# Patient Record
Sex: Female | Born: 1985 | Race: Black or African American | Hispanic: No | Marital: Single | Smoking: Current every day smoker
Health system: Southern US, Community
[De-identification: ages and names within clinical notes are randomized; demographics above are authoritative.]

## PROBLEM LIST (undated history)

## (undated) DIAGNOSIS — D649 Anemia, unspecified: Secondary | ICD-10-CM

## (undated) DIAGNOSIS — M1990 Unspecified osteoarthritis, unspecified site: Secondary | ICD-10-CM

## (undated) DIAGNOSIS — S46001A Unspecified injury of muscle(s) and tendon(s) of the rotator cuff of right shoulder, initial encounter: Secondary | ICD-10-CM

## (undated) HISTORY — PX: NO PAST SURGERIES: SHX2092

---
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
## 2003-01-28 ENCOUNTER — Emergency Department (HOSPITAL_COMMUNITY): Admission: EM | Admit: 2003-01-28 | Discharge: 2003-01-29 | Payer: Self-pay | Admitting: Emergency Medicine

## 2003-07-18 ENCOUNTER — Emergency Department (HOSPITAL_COMMUNITY): Admission: EM | Admit: 2003-07-18 | Discharge: 2003-07-18 | Payer: Self-pay | Admitting: Emergency Medicine

## 2005-02-14 ENCOUNTER — Emergency Department (HOSPITAL_COMMUNITY): Admission: EM | Admit: 2005-02-14 | Discharge: 2005-02-14 | Payer: Self-pay | Admitting: Emergency Medicine

## 2005-09-14 ENCOUNTER — Other Ambulatory Visit: Admission: RE | Admit: 2005-09-14 | Discharge: 2005-09-14 | Payer: Self-pay | Admitting: Family Medicine

## 2006-01-26 ENCOUNTER — Emergency Department (HOSPITAL_COMMUNITY): Admission: EM | Admit: 2006-01-26 | Discharge: 2006-01-26 | Payer: Self-pay | Admitting: Emergency Medicine

## 2006-03-08 ENCOUNTER — Emergency Department (HOSPITAL_COMMUNITY): Admission: EM | Admit: 2006-03-08 | Discharge: 2006-03-08 | Payer: Self-pay | Admitting: Emergency Medicine

## 2006-03-09 ENCOUNTER — Ambulatory Visit: Payer: Self-pay | Admitting: Hospitalist

## 2006-04-14 ENCOUNTER — Ambulatory Visit: Payer: Self-pay | Admitting: Internal Medicine

## 2006-04-14 ENCOUNTER — Encounter (INDEPENDENT_AMBULATORY_CARE_PROVIDER_SITE_OTHER): Payer: Self-pay | Admitting: Internal Medicine

## 2006-05-19 ENCOUNTER — Encounter (INDEPENDENT_AMBULATORY_CARE_PROVIDER_SITE_OTHER): Payer: Self-pay | Admitting: Gynecology

## 2006-05-19 ENCOUNTER — Ambulatory Visit: Payer: Self-pay | Admitting: Gynecology

## 2006-07-04 DIAGNOSIS — R87619 Unspecified abnormal cytological findings in specimens from cervix uteri: Secondary | ICD-10-CM | POA: Insufficient documentation

## 2006-07-04 DIAGNOSIS — B977 Papillomavirus as the cause of diseases classified elsewhere: Secondary | ICD-10-CM | POA: Insufficient documentation

## 2007-01-24 ENCOUNTER — Inpatient Hospital Stay (HOSPITAL_COMMUNITY): Admission: AD | Admit: 2007-01-24 | Discharge: 2007-01-27 | Payer: Self-pay | Admitting: Obstetrics and Gynecology

## 2007-08-14 ENCOUNTER — Emergency Department (HOSPITAL_COMMUNITY): Admission: EM | Admit: 2007-08-14 | Discharge: 2007-08-14 | Payer: Self-pay | Admitting: Emergency Medicine

## 2007-12-31 ENCOUNTER — Emergency Department (HOSPITAL_COMMUNITY): Admission: EM | Admit: 2007-12-31 | Discharge: 2007-12-31 | Payer: Self-pay | Admitting: Emergency Medicine

## 2008-05-22 ENCOUNTER — Emergency Department (HOSPITAL_COMMUNITY): Admission: EM | Admit: 2008-05-22 | Discharge: 2008-05-23 | Payer: Self-pay | Admitting: Emergency Medicine

## 2008-06-19 ENCOUNTER — Emergency Department (HOSPITAL_COMMUNITY): Admission: EM | Admit: 2008-06-19 | Discharge: 2008-06-19 | Payer: Self-pay | Admitting: Emergency Medicine

## 2008-09-11 ENCOUNTER — Emergency Department (HOSPITAL_COMMUNITY): Admission: EM | Admit: 2008-09-11 | Discharge: 2008-09-12 | Payer: Self-pay | Admitting: Emergency Medicine

## 2008-11-13 ENCOUNTER — Emergency Department (HOSPITAL_COMMUNITY): Admission: EM | Admit: 2008-11-13 | Discharge: 2008-11-13 | Payer: Self-pay | Admitting: Emergency Medicine

## 2008-12-23 ENCOUNTER — Emergency Department (HOSPITAL_COMMUNITY): Admission: EM | Admit: 2008-12-23 | Discharge: 2008-12-23 | Payer: Self-pay | Admitting: Family Medicine

## 2009-02-11 ENCOUNTER — Emergency Department (HOSPITAL_COMMUNITY): Admission: EM | Admit: 2009-02-11 | Discharge: 2009-02-11 | Payer: Self-pay | Admitting: Emergency Medicine

## 2009-03-17 ENCOUNTER — Emergency Department (HOSPITAL_COMMUNITY): Admission: EM | Admit: 2009-03-17 | Discharge: 2009-03-17 | Payer: Self-pay | Admitting: Family Medicine

## 2009-06-19 ENCOUNTER — Emergency Department (HOSPITAL_COMMUNITY): Admission: EM | Admit: 2009-06-19 | Discharge: 2009-06-19 | Payer: Self-pay | Admitting: Family Medicine

## 2009-10-01 ENCOUNTER — Emergency Department (HOSPITAL_COMMUNITY): Admission: EM | Admit: 2009-10-01 | Discharge: 2009-10-01 | Payer: Self-pay | Admitting: Emergency Medicine

## 2010-03-24 ENCOUNTER — Emergency Department (HOSPITAL_COMMUNITY): Admission: EM | Admit: 2010-03-24 | Discharge: 2010-03-24 | Payer: Self-pay | Admitting: Emergency Medicine

## 2010-08-26 ENCOUNTER — Emergency Department (HOSPITAL_COMMUNITY)
Admission: EM | Admit: 2010-08-26 | Discharge: 2010-08-26 | Disposition: A | Discharge status: Home / Self Care | Payer: Self-pay | Attending: Emergency Medicine | Admitting: Emergency Medicine

## 2010-08-26 DIAGNOSIS — M25519 Pain in unspecified shoulder: Secondary | ICD-10-CM | POA: Insufficient documentation

## 2010-08-26 DIAGNOSIS — M545 Low back pain, unspecified: Secondary | ICD-10-CM | POA: Insufficient documentation

## 2010-09-21 LAB — POCT URINALYSIS DIP (DEVICE)
Bilirubin Urine: NEGATIVE
Glucose, UA: NEGATIVE mg/dL
Ketones, ur: NEGATIVE mg/dL
Nitrite: NEGATIVE

## 2010-09-21 LAB — WET PREP, GENITAL
Trich, Wet Prep: NONE SEEN
Yeast Wet Prep HPF POC: NONE SEEN

## 2010-09-21 LAB — GC/CHLAMYDIA PROBE AMP, GENITAL: Chlamydia, DNA Probe: NEGATIVE

## 2010-09-26 LAB — POCT URINALYSIS DIP (DEVICE)
Ketones, ur: NEGATIVE mg/dL
Protein, ur: NEGATIVE mg/dL
Specific Gravity, Urine: 1.015 (ref 1.005–1.030)
Urobilinogen, UA: 1 mg/dL (ref 0.0–1.0)
pH: 7 (ref 5.0–8.0)

## 2010-09-26 LAB — GC/CHLAMYDIA PROBE AMP, GENITAL
Chlamydia, DNA Probe: NEGATIVE
GC Probe Amp, Genital: NEGATIVE

## 2010-09-26 LAB — WET PREP, GENITAL

## 2010-09-27 LAB — POCT URINALYSIS DIP (DEVICE)
Glucose, UA: NEGATIVE mg/dL
Nitrite: NEGATIVE
Urobilinogen, UA: 2 mg/dL — ABNORMAL HIGH (ref 0.0–1.0)

## 2010-09-27 LAB — GC/CHLAMYDIA PROBE AMP, GENITAL
Chlamydia, DNA Probe: NEGATIVE
GC Probe Amp, Genital: NEGATIVE

## 2010-11-03 NOTE — Discharge Summary (Signed)
[name], [name]               ACCOUNT NO.:  [id number]   MEDICAL RECORD NO.:  [id number]          PATIENT TYPE:  INP   LOCATION:  9109                          FACILITY:  [facility]   PHYSICIAN:  Sherron Monday, MD        DATE OF BIRTH:  04-Jun-1986   DATE OF ADMISSION:  01/24/2007  DATE OF DISCHARGE:  01/27/2007                               DISCHARGE SUMMARY   ADMITTING DIAGNOSES:  Intrauterine pregnancy at term, spontaneous  rupture of membranes.   DISCHARGE DIAGNOSES:  Intrauterine pregnancy at term, spontaneous  rupture of membranes, delivered by vacuum-assisted vaginal delivery.   HISTORY OF PRESENT ILLNESS:  A 25 year old G 1, P 0, at 39-3/7 weeks  with contractions and complaints of loss of fluid that was clear.  She  states she has contractions frequently but has not been counting them.  No vaginal bleeding.  Decreased fetal movement today.  However,  as she  was being seen in the MAU it was okay.   On the external vaginal exam there was a pool and Nitrazine was  positive; however, fern was negative, but her vaginal exam had changed,  so she was admitted and her labor was augmented with the presumed  diagnosis of rupture of membranes.   PAST MEDICAL HISTORY:  Not significant.   PAST SURGICAL HISTORY:  Not significant.   PAST OB/GYN HISTORY:  G 1, present pregnancy.  She has a history of an  abnormal Pap smear with a colposcopy and that has been normal since.  She has a history of Trichomonas, no other STDs.   MEDICATIONS:  Prenatal vitamins.   ALLERGIES:  No known drug allergies.   SOCIAL HISTORY:  Denies alcohol, tobacco or drug use.  She is single.   FAMILY HISTORY:  Significant for paternal grandmother with lung cancer,  material grandmother with uterine cancer.  No diabetes.  Hypertension on  her maternal side and thyroid disease on her maternal side.   PRENATAL LABS:  Hemoglobin 12.3.  A positive.  Antibody screen negative.  Gonorrhea negative.  Chlamydia  negative.  RPR nonreactive.  Rubella  equivocal.  Hepatitis B surface antigen negative.  HIV negative.  Glucola of 88.  Group B strep screen was positive.   Ultrasounds and prenatal care revealed an [facility] of January 28, 2007; this  was performed on September 20, 2006, at 21 weeks revealing normal anatomy  except an isolated EIF, anterior placenta, and a female infant.   PHYSICAL EXAMINATION:  On admission she is afebrile, vital signs stable,  with a benign exam.  Fetal heart tones are 120s-130s and minor  variability.  Contractions are every five minutes.  Cervical exam is 3-4  cm dilated, 90% effaced and minus 1 to 0 station.   She was admitted to labor and delivery, given penicillin for prophylaxis  and Pitocin to augment her labor.  Her intrapartum course was relatively  uncomplicated.  An artificial rupture of membranes, of a likely forebag,  was performed after two doses of penicillin.  Throughout her labor she  had slow cervical change.  This was discussed with  her as a possible  need for cesarean section.  However, she did progress to complete  complete plus 1 to 2 at approximately 5:20 a.m. on the 6th of August she  pushed well to change to complete complete plus 2 to 3.  Kiwi vacuum  assistance was applied and helped the infant get to complete complete  plus 3.  There was pop-off secondary to vaginal bleeding and the  decision was made to await another application of the Kiwi.  Patient  pushed for a total of approximately two hours and 30 minutes to be able  to have a vacuum-assisted outlet delivery with no pop-off, viable female  infant at 8:07, Apgars of 8 at one minute, 9 at five minutes, and weight  of 8 pounds 4 ounces.  Placenta was expressed intact.  There were  lacerations, right paraurethral, bilateral saddle.  Lacerations were  repaired with 3-0 Vicryl.  EBL was less than 500 mL.  Her postpartum  course was relatively uncomplicated.  She remained afebrile with stable  vital signs  throughout.  Her hemoglobin decreased from 14.3 to 11.4.  She was discharged home on postpartum day #2 with no complaints, normal  lochia, and her pain was well controlled with oral pain medicine.  She  was discharged with Motrin, Vicodin, prenatal vitamins as well as  routine discharge instructions and numbers to call with any questions or  problems.   DISCHARGE INFORMATION:  A positive.  Rubella equivocal, for which she  received an MMR prior to discharge.  She was breast feeding.  Her  hemoglobin went from 14.3 to 11.4.  She plans on getting an IUD at her  six week postpartum check for contraception.      Sherron Monday, MD  Electronically Signed     JB/MEDQ  D:  01/27/2007  T:  01/27/2007  Job:  [id number]

## 2010-12-04 ENCOUNTER — Inpatient Hospital Stay (INDEPENDENT_AMBULATORY_CARE_PROVIDER_SITE_OTHER)
Admission: RE | Admit: 2010-12-04 | Discharge: 2010-12-04 | Disposition: A | Discharge status: Home / Self Care | Payer: Self-pay | Source: Ambulatory Visit | Attending: Family Medicine | Admitting: Family Medicine

## 2010-12-04 DIAGNOSIS — H6120 Impacted cerumen, unspecified ear: Secondary | ICD-10-CM

## 2010-12-12 ENCOUNTER — Inpatient Hospital Stay (INDEPENDENT_AMBULATORY_CARE_PROVIDER_SITE_OTHER)
Admission: RE | Admit: 2010-12-12 | Discharge: 2010-12-12 | Disposition: A | Discharge status: Home / Self Care | Payer: Self-pay | Source: Ambulatory Visit | Attending: Family Medicine | Admitting: Family Medicine

## 2010-12-12 DIAGNOSIS — L989 Disorder of the skin and subcutaneous tissue, unspecified: Secondary | ICD-10-CM

## 2011-03-12 LAB — URINALYSIS, ROUTINE W REFLEX MICROSCOPIC
Bilirubin Urine: NEGATIVE
Glucose, UA: NEGATIVE
Hgb urine dipstick: NEGATIVE
Ketones, ur: 15 — AB
Nitrite: NEGATIVE
Protein, ur: NEGATIVE
Specific Gravity, Urine: 1.036 — ABNORMAL HIGH
Urobilinogen, UA: 1
pH: 6

## 2011-03-12 LAB — POCT PREGNANCY, URINE
Operator id: 277751
Preg Test, Ur: NEGATIVE

## 2011-03-12 LAB — WET PREP, GENITAL
Trich, Wet Prep: NONE SEEN
Yeast Wet Prep HPF POC: NONE SEEN

## 2011-03-18 LAB — URINALYSIS, ROUTINE W REFLEX MICROSCOPIC
Bilirubin Urine: NEGATIVE
Hgb urine dipstick: NEGATIVE
Specific Gravity, Urine: 1.007
Urobilinogen, UA: 0.2
pH: 8

## 2011-03-18 LAB — WET PREP, GENITAL: Yeast Wet Prep HPF POC: NONE SEEN

## 2011-03-18 LAB — RPR: RPR Ser Ql: NONREACTIVE

## 2011-03-18 LAB — GC/CHLAMYDIA PROBE AMP, GENITAL: GC Probe Amp, Genital: NEGATIVE

## 2011-03-18 LAB — URINE MICROSCOPIC-ADD ON

## 2011-03-18 LAB — POCT PREGNANCY, URINE: Preg Test, Ur: NEGATIVE

## 2011-04-05 LAB — CBC
HCT: 34 — ABNORMAL LOW
HCT: 44.3
MCHC: 33.1
MCHC: 33.2
Platelets: 128 — ABNORMAL LOW
Platelets: 162
RBC: 4.8
RDW: 16.5 — ABNORMAL HIGH
RDW: 16.8 — ABNORMAL HIGH
WBC: 10
WBC: 18.5 — ABNORMAL HIGH

## 2011-04-05 LAB — RAPID HIV SCREEN (WH-MAU): Rapid HIV Screen: NONREACTIVE

## 2011-08-29 ENCOUNTER — Encounter (HOSPITAL_COMMUNITY): Payer: Self-pay | Admitting: *Deleted

## 2011-08-29 ENCOUNTER — Emergency Department (HOSPITAL_COMMUNITY)
Admission: EM | Admit: 2011-08-29 | Discharge: 2011-08-29 | Disposition: A | Discharge status: Home / Self Care | Payer: Self-pay | Source: Home / Self Care | Attending: Emergency Medicine | Admitting: Emergency Medicine

## 2011-08-29 DIAGNOSIS — M719 Bursopathy, unspecified: Secondary | ICD-10-CM

## 2011-08-29 DIAGNOSIS — M7552 Bursitis of left shoulder: Secondary | ICD-10-CM

## 2011-08-29 HISTORY — DX: Unspecified injury of muscle(s) and tendon(s) of the rotator cuff of right shoulder, initial encounter: S46.001A

## 2011-08-29 MED ORDER — IBUPROFEN 600 MG PO TABS: 600.0000 mg | ORAL_TABLET | Freq: Four times a day (QID) | ORAL | Status: AC | PRN

## 2011-08-29 MED ORDER — HYDROCODONE-ACETAMINOPHEN 5-325 MG PO TABS: 2.0000 | ORAL_TABLET | ORAL | Status: AC | PRN

## 2011-08-29 NOTE — Discharge Instructions (Signed)
Take the medication as written. Take 1 gram of tylenol with the motrin up to 4 times a day as needed for pain and fever. This is an effective combination for pain. Take the hydrocodone/norco only for severe pain. Do not take the tylenol and hydrocodone/norcoas they both have tylenol in them and too much can hurt your liver. Return if you get worse, have a  fever >100.4, or for any concerns.   Go to www.goodrx.com to look up your medications. This will give you a list of where you can find your prescriptions at the most affordable prices.   

## 2011-08-29 NOTE — ED Notes (Signed)
Pt with c/o left shoulder pain onset yesterday while making bed heard a pop - also pt with c/o earache and sore throat x 3 days

## 2011-08-29 NOTE — ED Provider Notes (Signed)
History     CSN: [id number]  Arrival date & time 08/29/11  1530   First MD Initiated Contact with Patient 08/29/11 1557      Chief Complaint  Patient presents with  . Shoulder Pain    onset yesterday popped while making bed   . Otalgia  . Sore Throat    (Consider location/radiation/quality/duration/timing/severity/associated sxs/prior treatment) HPI Comments: Patient is a right-handed female who reports bending forward to make a bed yesterday, and felt a "pop" in her left shoulder and she had a fully extended. No reports dull, achy, nonradiating pain in the shoulder joint, worse with use, palpation. No swelling, numbness, weakness, bruising, redness, deformity. No history of injury to this shoulder. Has been using icy hot ointment with mild relief.  ROS as noted in HPI. All other ROS negative.   Patient is a 26 y.o. female presenting with shoulder pain. No language interpreter was used.  Shoulder Pain This is a new problem. The current episode started yesterday. The problem occurs constantly. The symptoms are relieved by nothing. Treatments tried: icy hot. The treatment provided mild relief.    Past Medical History  Diagnosis Date  . Injury of right rotator cuff     History reviewed. No pertinent past surgical history.  Family History  Problem Relation Age of Onset  . Cancer Other     History  Substance Use Topics  . Smoking status: Current Everyday Smoker  . Smokeless tobacco: Not on file  . Alcohol Use: No    OB History    Grav Para Term Preterm Abortions TAB SAB Ect Mult Living                  Review of Systems  Allergies  Review of patient's allergies indicates no known allergies.  Home Medications   Current Outpatient Rx  Name Route Sig Dispense Refill  . HYDROCODONE-ACETAMINOPHEN 5-325 MG PO TABS Oral Take 2 tablets by mouth every 4 (four) hours as needed for pain. 20 tablet 0  . IBUPROFEN 600 MG PO TABS Oral Take 1 tablet (600 mg total) by mouth  every 6 (six) hours as needed for pain. 30 tablet 0    BP 108/66  Pulse 78  Temp(Src) 98.6 F (37 C) (Oral)  Resp 16  SpO2 100%  Physical Exam  Nursing note and vitals reviewed. Constitutional: She is oriented to person, place, and time. She appears well-developed and well-nourished. No distress.  HENT:  Head: Normocephalic and atraumatic.  Eyes: Conjunctivae and EOM are normal.  Neck: Normal range of motion.  Cardiovascular: Normal rate.   Pulmonary/Chest: Effort normal.  Abdominal: She exhibits no distension.  Musculoskeletal: Normal range of motion.       Left shoulder with ROM  somewhat limited, Drop test painful but negative, Tenderness lateral aspect of shoulder, and along bicipital tendon. clavicle NT, A/C joint NT, scapula NT, proximal humerus NT.  apprehension test negative  Motor strength decreased at shoulder due to pain, Sensation intact LT over deltoid region, distal NVI with hand on affected side having intact sensation and strength in the distribution of the median, radial, and ulnar nerve. Negative empty can, liftoff test.  Neurological: She is alert and oriented to person, place, and time.  Skin: Skin is warm and dry.  Psychiatric: She has a normal mood and affect. Her behavior is normal. Judgment and thought content normal.    ED Course  Procedures (including critical care time)  Labs Reviewed - No data to display No  results found.   1. Bursitis of shoulder, left       MDM  Neg empty can, liftoff test. Tender bicipital tendion, and along shoulder bursae. No sx/sn rotator cuff tear. More suggestive of bursitis.  Domenick Gong, MD 08/29/11 2118

## 2011-11-02 ENCOUNTER — Emergency Department (HOSPITAL_COMMUNITY)
Admission: EM | Admit: 2011-11-02 | Discharge: 2011-11-03 | Disposition: A | Discharge status: Home / Self Care | Payer: Self-pay | Attending: Emergency Medicine | Admitting: Emergency Medicine

## 2011-11-02 ENCOUNTER — Encounter (HOSPITAL_COMMUNITY): Payer: Self-pay | Admitting: Adult Health

## 2011-11-02 DIAGNOSIS — N949 Unspecified condition associated with female genital organs and menstrual cycle: Secondary | ICD-10-CM | POA: Insufficient documentation

## 2011-11-02 DIAGNOSIS — A499 Bacterial infection, unspecified: Secondary | ICD-10-CM | POA: Insufficient documentation

## 2011-11-02 DIAGNOSIS — B9689 Other specified bacterial agents as the cause of diseases classified elsewhere: Secondary | ICD-10-CM | POA: Insufficient documentation

## 2011-11-02 DIAGNOSIS — R102 Pelvic and perineal pain: Secondary | ICD-10-CM

## 2011-11-02 DIAGNOSIS — N760 Acute vaginitis: Secondary | ICD-10-CM | POA: Insufficient documentation

## 2011-11-02 LAB — URINALYSIS, ROUTINE W REFLEX MICROSCOPIC
Bilirubin Urine: NEGATIVE
Leukocytes, UA: NEGATIVE
Nitrite: NEGATIVE
Specific Gravity, Urine: 1.025 (ref 1.005–1.030)
Urobilinogen, UA: 0.2 mg/dL (ref 0.0–1.0)
pH: 6 (ref 5.0–8.0)

## 2011-11-02 LAB — POCT PREGNANCY, URINE: Preg Test, Ur: NEGATIVE

## 2011-11-02 LAB — WET PREP, GENITAL

## 2011-11-02 NOTE — ED Provider Notes (Signed)
History     CSN: [id number]  Arrival date & time 11/02/11  2052   First MD Initiated Contact with Patient 11/02/11 2244      Chief Complaint  Patient presents with  . Abdominal Pain    (Consider location/radiation/quality/duration/timing/severity/associated sxs/prior treatment) HPI Comments: Patient with history of HPV presents emergency department with chief complaint of right lower quadrant abdominal pain.  Onset of symptoms began 3 days ago and are described as intermittent and pressure-like in nature.  Associated symptoms include increase abnormal white discharge, the patient denies any older.  Last menstrual period is unknown and patient states that she might be pregnant because she missed her last Depo shot.  Patient states she has a OB/GYN appointment for next week for irregular checkup.  Patient is sexually active and does not currently use protection with her partner. She denies urinary symptoms, fevers, night sweats, chills.  Patient is a 26 y.o. female presenting with abdominal pain. The history is provided by the patient.  Abdominal Pain The primary symptoms of the illness include abdominal pain and vaginal discharge. The primary symptoms of the illness do not include fever, shortness of breath, nausea, vomiting, diarrhea, dysuria or vaginal bleeding.  The vaginal discharge is not associated with dysuria.  Symptoms associated with the illness do not include chills, constipation, urgency, hematuria or frequency.    Past Medical History  Diagnosis Date  . Injury of right rotator cuff     History reviewed. No pertinent past surgical history.  Family History  Problem Relation Age of Onset  . Cancer Other     History  Substance Use Topics  . Smoking status: Current Everyday Smoker  . Smokeless tobacco: Not on file  . Alcohol Use: No    OB History    Grav Para Term Preterm Abortions TAB SAB Ect Mult Living                  Review of Systems  Constitutional:  Negative for fever, chills and appetite change.  HENT: Negative for congestion.   Eyes: Negative for visual disturbance.  Respiratory: Negative for shortness of breath.   Cardiovascular: Negative for chest pain and leg swelling.  Gastrointestinal: Positive for abdominal pain. Negative for nausea, vomiting, diarrhea, constipation, blood in stool and rectal pain.  Genitourinary: Positive for vaginal discharge. Negative for dysuria, urgency, frequency, hematuria, decreased urine volume, vaginal bleeding, genital sores, vaginal pain and pelvic pain.  Neurological: Negative for dizziness, syncope, weakness, light-headedness, numbness and headaches.  Psychiatric/Behavioral: Negative for confusion.  All other systems reviewed and are negative.    Allergies  Review of patient's allergies indicates no known allergies.  Home Medications  No current outpatient prescriptions on file.  BP 124/73  Pulse 83  Temp(Src) 98.3 F (36.8 C) (Oral)  Resp 18  SpO2 100%  Physical Exam  Nursing note and vitals reviewed. Constitutional: Vital signs are normal. She appears well-developed and well-nourished. No distress.  HENT:  Head: Normocephalic and atraumatic.  Mouth/Throat: Uvula is midline, oropharynx is clear and moist and mucous membranes are normal.  Eyes: Conjunctivae and EOM are normal. Pupils are equal, round, and reactive to light.  Neck: Normal range of motion and full passive range of motion without pain. Neck supple. No spinous process tenderness and no muscular tenderness present. No rigidity. No Brudzinski's sign noted.  Cardiovascular: Normal rate and regular rhythm.   Pulmonary/Chest: Effort normal and breath sounds normal. No accessory muscle usage. Not tachypneic. No respiratory distress.  Abdominal: Soft.  Normal appearance. She exhibits no distension, no ascites, no pulsatile midline mass and no mass. There is tenderness. There is no CVA tenderness. No hernia.         Mild RLQ abd  tenderness, without peritoneal signs  Genitourinary: Pelvic exam was performed with patient supine.       Exam performed by Jaci Carrel,  exam chaperoned Date: 11/02/2011 Pelvic exam: normal external genitalia without evidence of trauma. VULVA: normal appearing vulva with no masses, tenderness or lesion. VAGINA: normal appearing vagina with normal color and discharge, no lesions. CERVIX: normal appearing cervix without lesions, cervical motion tenderness absent, cervical os closed with out purulent discharge; vaginal discharge - white and copious, Wet prep and DNA probe for chlamydia and GC obtained.   ADNEXA: normal adnexa in size, nontender and no masses UTERUS: uterus is normal size, shape, consistency and nontender.    Lymphadenopathy:    She has no cervical adenopathy.  Neurological: She is alert.  Skin: Skin is warm and dry. No rash noted. She is not diaphoretic.  Psychiatric: She has a normal mood and affect. Her speech is normal and behavior is normal.    ED Course  Procedures (including critical care time)  Labs Reviewed  WET PREP, GENITAL - Abnormal; Notable for the following:    Clue Cells Wet Prep HPF POC MANY (*)    WBC, Wet Prep HPF POC MANY (*)    All other components within normal limits  URINALYSIS, ROUTINE W REFLEX MICROSCOPIC  POCT PREGNANCY, URINE  GC/CHLAMYDIA PROBE AMP, GENITAL   No results found.   No diagnosis found.    MDM  Vaginal discharge, Bacterial Vaginosis, ?STI   Patient to be discharged with instructions to follow up with OBGYN. Discussed importance of using protection when sexually active. Pt understands that they have GC/Chlamydia cultures pending and that they will need to inform all sexual partners if results return positive. Pt has been treated prophylacticly with azithromycin and rocephin due to pts history of HPV and wet prep with increased WBCs. Pt not concerning for PID because hemodynamically stable and no cervical motion tenderness  on pelvic exam. Pt has also been treated with flagyl for Bacterial Vaginosis. Pt has been advised to not drink alcohol while on this medication. Transvaginal US without evidence of torsion or TOA        Coralynn Gaona, PA-C 11/04/11 0041

## 2011-11-02 NOTE — ED Notes (Signed)
Reports lower abdominal pain described as cramping  that began a couple days ago associated with white discharge denies odor. States, "I may be pregnant" missed last depo shot. LMP: unsure.

## 2011-11-03 ENCOUNTER — Emergency Department (HOSPITAL_COMMUNITY): Payer: Self-pay

## 2011-11-03 MED ORDER — AZITHROMYCIN 250 MG PO TABS
1000.0000 mg | ORAL_TABLET | Freq: Once | ORAL | Status: AC
  Administered 2011-11-03: 1000 mg via ORAL
  Filled 2011-11-03: qty 4

## 2011-11-03 MED ORDER — NAPROXEN 500 MG PO TABS: 500.0000 mg | ORAL_TABLET | Freq: Two times a day (BID) | ORAL | Status: DC

## 2011-11-03 MED ORDER — ONDANSETRON 4 MG PO TBDP
8.0000 mg | ORAL_TABLET | Freq: Once | ORAL | Status: AC
  Administered 2011-11-03: 8 mg via ORAL
  Filled 2011-11-03: qty 2

## 2011-11-03 MED ORDER — METRONIDAZOLE 500 MG PO TABS
2000.0000 mg | ORAL_TABLET | Freq: Once | ORAL | Status: AC
  Administered 2011-11-03: 2000 mg via ORAL
  Filled 2011-11-03: qty 4

## 2011-11-03 MED ORDER — CEFTRIAXONE SODIUM 250 MG IJ SOLR
250.0000 mg | Freq: Once | INTRAMUSCULAR | Status: AC
  Administered 2011-11-03: 250 mg via INTRAMUSCULAR
  Filled 2011-11-03: qty 250

## 2011-11-03 NOTE — ED Notes (Signed)
Pt resting in stretcher.  Pt aware of possible long wait for [place].  Pt denies any needs at this time.

## 2011-11-03 NOTE — ED Notes (Signed)
Pt complaining of nausea. MD notified.

## 2011-11-03 NOTE — Discharge Instructions (Signed)
You have been treated in the emergency department for an infection, possibly sexually transmitted. Results of your gonorrhea and chlamydia tests are pending and you will be notified if they are positive. It is very important to practice safe sex and use condoms when sexually active. If your results are positive you need to notify all sexual partners so they can be treated as well. The website [email or web address].net/ can be used to send anonymous text messages or emails to alert sexual contacts. Follow up with your doctor, or OBGYN in regards to today's visit.    Bacterial Vaginosis Bacterial vaginosis (BV) is a vaginal infection where the normal balance of bacteria in the vagina is disrupted. The normal balance is then replaced by an overgrowth of certain bacteria. There are several different kinds of bacteria that can cause BV. BV is the most common vaginal infection in women of childbearing age. CAUSES   The cause of BV is not fully understood. BV develops when there is an increase or imbalance of harmful bacteria.   Some activities or behaviors can upset the normal balance of bacteria in the vagina and put women at increased risk including:   Having a new sex partner or multiple sex partners.   Douching.   Using an intrauterine device (IUD) for contraception.   It is not clear what role sexual activity plays in the development of BV. However, women that have never had sexual intercourse are rarely infected with BV.  Women do not get BV from toilet seats, bedding, swimming pools or from touching objects around them.  SYMPTOMS   Grey vaginal discharge.   A fish-like odor with discharge, especially after sexual intercourse.   Itching or burning of the vagina and vulva.   Burning or pain with urination.   Some women have no signs or symptoms at all.  DIAGNOSIS  Your caregiver must examine the vagina for signs of BV. Your caregiver will perform lab tests and look at the sample of  vaginal fluid through a microscope. They will look for bacteria and abnormal cells (clue cells), a pH test higher than 4.5, and a positive amine test all associated with BV.  RISKS AND COMPLICATIONS   Pelvic inflammatory disease (PID).   Infections following gynecology surgery.   Developing HIV.   Developing herpes virus.  TREATMENT  Sometimes BV will clear up without treatment. However, all women with symptoms of BV should be treated to avoid complications, especially if gynecology surgery is planned. Female partners generally do not need to be treated. However, BV may spread between female sex partners so treatment is helpful in preventing a recurrence of BV.   BV may be treated with antibiotics. The antibiotics come in either pill or vaginal cream forms. Either can be used with nonpregnant or pregnant women, but the recommended dosages differ. These antibiotics are not harmful to the baby.   BV can recur after treatment. If this happens, a second round of antibiotics will often be prescribed.   Treatment is important for pregnant women. If not treated, BV can cause a premature delivery, especially for a pregnant woman who had a premature birth in the past. All pregnant women who have symptoms of BV should be checked and treated.   For chronic reoccurrence of BV, treatment with a type of prescribed gel vaginally twice a week is helpful.  HOME CARE INSTRUCTIONS   Finish all medication as directed by your caregiver.   Do not have sex until treatment is completed.   Tell  your sexual partner that you have a vaginal infection. They should see their caregiver and be treated if they have problems, such as a mild rash or itching.   Practice safe sex. Use condoms. Only have 1 sex partner.  PREVENTION  Basic prevention steps can help reduce the risk of upsetting the natural balance of bacteria in the vagina and developing BV:  Do not have sexual intercourse (be abstinent).   Do not douche.     Use all of the medicine prescribed for treatment of BV, even if the signs and symptoms go away.   Tell your sex partner if you have BV. That way, they can be treated, if needed, to prevent reoccurrence.  SEEK MEDICAL CARE IF:   Your symptoms are not improving after 3 days of treatment.   You have increased discharge, pain, or fever.   Gonorrhea and Chlamydia SYMPTOMS  In females, symptoms may go unnoticed. Symptoms that are more noticeable can include:  Belly (abdominal) pain.  Painful intercourse.  Watery mucous-like discharge from the vagina.  Miscarriage.  Discomfort when urinating.  Inflammation of the rectum.  Abnormal gray-green frothy vaginal discharge  Vaginal itching and irritatio  Itching and irritation of the area outside the vagina.   Painful urination.  Bleeding after sexual intercourse.  In males, symptoms include:  Burning with urination.  Pain in the testicles.  Watery mucous-like discharge from the penis.  It can cause longstanding (chronic) pelvic pain after frequent infections.  TREATMENT  PID can cause women to not be able to have children (sterile) if left untreated or if half-treated.  It is important to finish ALL medications given to you.  This is a sexually transmitted infection. So you are also at risk for other sexually transmitted diseases, including HIV (AIDS), it is recommended that you get tested. HOME CARE INSTRUCTIONS  Warning: This infection is contagious. Do not have sex until treatment is completed. Follow up at your caregiver's office or the clinic to which you were referred. If your diagnosis (learning what is wrong) is confirmed by culture or some other method, your recent sexual contacts need treatment. Even if they are symptom free or have a negative culture or evaluation, they should be treated.  PREVENTION  Women should use sanitary pads instead of tampons for vaginal discharge.  Wipe front to back after using the toilet and avoid  douching.   Practice safe sex, use condoms, have only one sex partner and be sure your sex partner is not having sex with others.  Ask your caregiver to test you for chlamydia at your regular checkups or sooner if you are having symptoms.  Ask for further information if you are pregnant.  SEEK IMMEDIATE MEDICAL CARE IF:  You develop an oral temperature above 102 F (38.9 C), not controlled by medications or lasting more than 2 days.  You develop an increase in pain.  You develop any type of abnormal discharge.  You develop vaginal bleeding and it is not time for your period.  You develop painful intercourse.   Bacterial Vaginosis  Bacterial vaginosis (BV) is a vaginal infection where the normal balance of bacteria in the vagina is disrupted. This is not a sexually transmitted disease and your sexual partners do NOT need to be treated. CAUSES  The cause of BV is not fully understood. BV develops when there is an increase or imbalance of harmful bacteria.  Some activities or behaviors can upset the normal balance of bacteria in the vagina and  put women at increased risk including:  Having a new sex partner or multiple sex partners.  Douching.  Using an intrauterine device (IUD) for contraception.  It is not clear what role sexual activity plays in the development of BV. However, women that have never had sexual intercourse are rarely infected with BV.  Women do not get BV from toilet seats, bedding, swimming pools or from touching objects around them.   SYMPTOMS  Grey vaginal discharge.  A fish-like odor with discharge, especially after sexual intercourse.  Itching or burning of the vagina and vulva.  Burning or pain with urination.  Some women have no signs or symptoms at all.   TREATMENT  Sometimes BV will clear up without treatment.  BV may be treated with antibiotics.  BV can recur after treatment. If this happens, a second round of antibiotics will often be prescribed.  HOME CARE  INSTRUCTIONS  Finish all medication as directed by your caregiver.  Do not have sex until treatment is completed.  Do NOT drink any alcoholic beverages while being treated  with Metronidazole (Flagyl). This will cause a severe reaction inducing vomiting.  RESOURCE GUIDE  Dental Problems  Patients with Medicaid: [facility] [phone] [address]                                           [phone] [address] Phone:  [phone]                                                  Phone:  [phone]  If unable to pay or uninsured, contact:  [facility] or [facility]. to become qualified for the adult dental clinic.  Chronic Pain Problems Contact [facility]  [phone] Patients need to be referred by their primary care doctor.  Insufficient Money for Medicine Contact [employer]:  call "211" or [facility] [phone]) [phone].  No Primary Care Doctor Call [facility]  [phone] Other agencies that provide inexpensive medical care    [facility]  [phone]    [facility]  [phone]    [facility]  [phone]    [facility]  [phone]    [facility]  [phone]    [facility]  [phone]  Psychological Services [facility]  [phone] [facility]  [phone] [facility]   [phone] (emergency services [phone])  Substance Abuse Resources [facility]  [phone] [facility] 667-039-6764 [facility] [phone] Floydene Flock [phone] Residential & Outpatient Substance Abuse Program  [phone]  Abuse/Neglect [facility] Child Abuse Hotline 781-623-5399 [facility] Child Abuse Hotline 205-776-1450 (After Hours)  Emergency Shelter [facility] [phone]  M[facility] [place] [facility] [phone] [facility]  Services [phone]  MRSA Hotline #:   (860)527-7324    [facility]  [facility] [address] [place]                       [address]      [address]  [place]                                                [place]                            [place] Phone:  [phone]                                   Phone:  [phone]                 Phone:  [phone]  [place] [facility] Phone:  [phone]  [place] Child Abuse Hotline (336) 342-1394 ([phone] (After Hours)    

## 2011-11-03 NOTE — ED Notes (Signed)
Provided pt with snack

## 2011-11-03 NOTE — ED Notes (Addendum)
R pelvic pain, associated with vaginal d/c  PE:  Has soft non tender abd, on pelvic per PA has adnexal ttp.  Pt appears in no distress, no n/v prior to evaluation.  Assessment:  [place] ordered to r/o torsion /cyst, abx given for d/c and ttp in the pelvic exam, labs pending for GC / Chlamydia, pt educated on results and need for f/u.  Medical screening examination/treatment/procedure(s) were conducted as a shared visit with non-physician practitioner(s) and myself.  I personally evaluated the patient during the encounter  Patient reevaluated and still has a soft nontender abdomen, ultrasound results reviewed with the patient and indications for return given. Patient agrees to followup within 24 hours if she should develop worsening or continuing pain. She states that she has a family doctor that she can call, Naprosyn given as a prescription.  Vida Roller, MD 11/03/11 [phone]  Vida Roller, MD 11/03/11 [phone]

## 2011-11-03 NOTE — ED Notes (Signed)
Patient transported to Ultrasound 

## 2011-11-03 NOTE — ED Notes (Signed)
Patient is AOx4 and comfortable with her discharge instructions. 

## 2011-11-03 NOTE — ED Notes (Signed)
Pt continues at U/S

## 2011-11-04 LAB — GC/CHLAMYDIA PROBE AMP, GENITAL
Chlamydia, DNA Probe: NEGATIVE
GC Probe Amp, Genital: NEGATIVE

## 2011-11-04 NOTE — ED Provider Notes (Signed)
Medical screening examination/treatment/procedure(s) were performed by non-physician practitioner and as supervising physician I was immediately available for consultation/collaboration.   Forbes Cellar, MD 11/04/11 [phone]

## 2012-01-31 ENCOUNTER — Encounter (HOSPITAL_COMMUNITY): Payer: Self-pay | Admitting: *Deleted

## 2012-01-31 ENCOUNTER — Emergency Department (HOSPITAL_COMMUNITY)
Admission: EM | Admit: 2012-01-31 | Discharge: 2012-01-31 | Disposition: A | Discharge status: Home / Self Care | Payer: Self-pay | Attending: Emergency Medicine | Admitting: Emergency Medicine

## 2012-01-31 DIAGNOSIS — F17200 Nicotine dependence, unspecified, uncomplicated: Secondary | ICD-10-CM | POA: Insufficient documentation

## 2012-01-31 DIAGNOSIS — H6120 Impacted cerumen, unspecified ear: Secondary | ICD-10-CM | POA: Insufficient documentation

## 2012-01-31 NOTE — ED Notes (Signed)
Pt is here with bilateral ear fullness for one week

## 2012-01-31 NOTE — ED Provider Notes (Signed)
History     CSN: [id number]  Arrival date & time 01/31/12  1422   First MD Initiated Contact with Patient 01/31/12 1628      Chief Complaint  Patient presents with  . Ear Fullness    (Consider location/radiation/quality/duration/timing/severity/associated sxs/prior treatment) Patient is a 26 y.o. female presenting with plugged ear sensation. The history is provided by the patient.  Ear Fullness  pt c/o bil ear fullness, muffled sounds for past several days. Constant. Moderate. No specific exacerbating or alleviating factors. No hx same. Denies nasal/sinus congestion. No sore throat. No allergy symptoms. Denies headache. No fevers. No trauma to ear, no drainage.   Past Medical History  Diagnosis Date  . Injury of right rotator cuff     History reviewed. No pertinent past surgical history.  Family History  Problem Relation Age of Onset  . Cancer Other     History  Substance Use Topics  . Smoking status: Current Everyday Smoker  . Smokeless tobacco: Not on file  . Alcohol Use: No    OB History    Grav Para Term Preterm Abortions TAB SAB Ect Mult Living                  Review of Systems  Constitutional: Negative for fever.  HENT: Negative for congestion, sore throat and rhinorrhea.     Allergies  Review of patient's allergies indicates no known allergies.  Home Medications  No current outpatient prescriptions on file.  BP 188/72  Pulse 78  Temp 98.5 F (36.9 C) (Oral)  Resp 18  SpO2 98%  Physical Exam  HENT:  Nose: Nose normal.  Mouth/Throat: Oropharynx is clear and moist.       bil cerumen impaction, right more so than left.   Eyes: Conjunctivae are normal.  Cardiovascular: Normal rate.   Pulmonary/Chest: Effort normal.  Lymphadenopathy:    She has no cervical adenopathy.  Neurological: She is alert.  Skin: Skin is warm and dry.    ED Course  Procedures (including critical care time)     MDM  Nursing staff to do ear irrigation re  cerumen impaction.          Suzi Roots, MD 01/31/12 1640

## 2012-07-04 ENCOUNTER — Emergency Department (HOSPITAL_COMMUNITY)
Admission: EM | Admit: 2012-07-04 | Discharge: 2012-07-04 | Disposition: A | Discharge status: Home / Self Care | Payer: Self-pay | Attending: Emergency Medicine | Admitting: Emergency Medicine

## 2012-07-04 ENCOUNTER — Encounter (HOSPITAL_COMMUNITY): Payer: Self-pay | Admitting: *Deleted

## 2012-07-04 DIAGNOSIS — F17200 Nicotine dependence, unspecified, uncomplicated: Secondary | ICD-10-CM | POA: Insufficient documentation

## 2012-07-04 DIAGNOSIS — K529 Noninfective gastroenteritis and colitis, unspecified: Secondary | ICD-10-CM

## 2012-07-04 DIAGNOSIS — Z3202 Encounter for pregnancy test, result negative: Secondary | ICD-10-CM | POA: Insufficient documentation

## 2012-07-04 DIAGNOSIS — M545 Low back pain, unspecified: Secondary | ICD-10-CM | POA: Insufficient documentation

## 2012-07-04 DIAGNOSIS — Z87828 Personal history of other (healed) physical injury and trauma: Secondary | ICD-10-CM | POA: Insufficient documentation

## 2012-07-04 DIAGNOSIS — R109 Unspecified abdominal pain: Secondary | ICD-10-CM | POA: Insufficient documentation

## 2012-07-04 DIAGNOSIS — R197 Diarrhea, unspecified: Secondary | ICD-10-CM | POA: Insufficient documentation

## 2012-07-04 DIAGNOSIS — K5289 Other specified noninfective gastroenteritis and colitis: Secondary | ICD-10-CM | POA: Insufficient documentation

## 2012-07-04 LAB — COMPREHENSIVE METABOLIC PANEL
ALT: 48 U/L — ABNORMAL HIGH (ref 0–35)
Alkaline Phosphatase: 62 U/L (ref 39–117)
BUN: 13 mg/dL (ref 6–23)
CO2: 28 mEq/L (ref 19–32)
Calcium: 9.4 mg/dL (ref 8.4–10.5)
GFR calc Af Amer: >90 mL/min (ref >90)
GFR calc non Af Amer: >90 mL/min (ref >90)
Glucose, Bld: 119 mg/dL — ABNORMAL HIGH (ref 70–99)
Sodium: 140 mEq/L (ref 135–145)
Total Protein: 8.4 g/dL — ABNORMAL HIGH (ref 6.0–8.3)

## 2012-07-04 LAB — URINALYSIS, ROUTINE W REFLEX MICROSCOPIC
Bilirubin Urine: NEGATIVE
Ketones, ur: NEGATIVE mg/dL
Nitrite: NEGATIVE
Specific Gravity, Urine: 1.034 — ABNORMAL HIGH (ref 1.005–1.030)
Urobilinogen, UA: 0.2 mg/dL (ref 0.0–1.0)
pH: 8 (ref 5.0–8.0)

## 2012-07-04 LAB — URINE MICROSCOPIC-ADD ON

## 2012-07-04 LAB — CBC WITH DIFFERENTIAL/PLATELET
Basophils Relative: 0 % (ref 0–1)
Eosinophils Absolute: 0 10*3/uL (ref 0.0–0.7)
Eosinophils Relative: 0 % (ref 0–5)
HCT: 41.2 % (ref 36.0–46.0)
Hemoglobin: 14.2 g/dL (ref 12.0–15.0)
MCH: 30.6 pg (ref 26.0–34.0)
MCHC: 34.5 g/dL (ref 30.0–36.0)
MCV: 88.8 fL (ref 78.0–100.0)
Monocytes Absolute: 0.3 10*3/uL (ref 0.1–1.0)
Neutro Abs: 7.8 10*3/uL — ABNORMAL HIGH (ref 1.7–7.7)
RBC: 4.64 MIL/uL (ref 3.87–5.11)

## 2012-07-04 MED ORDER — ONDANSETRON 4 MG PO TBDP: 4.0000 mg | ORAL_TABLET | Freq: Three times a day (TID) | ORAL | Status: DC | PRN

## 2012-07-04 MED ORDER — HYDROCODONE-ACETAMINOPHEN 5-325 MG PO TABS: 2.0000 | ORAL_TABLET | ORAL | Status: DC | PRN

## 2012-07-04 MED ORDER — FENTANYL CITRATE 0.05 MG/ML IJ SOLN
100.0000 ug | Freq: Once | INTRAMUSCULAR | Status: AC
  Administered 2012-07-04: 100 ug via INTRAVENOUS
  Filled 2012-07-04: qty 2

## 2012-07-04 MED ORDER — SODIUM CHLORIDE 0.9 % IV BOLUS (SEPSIS)
1000.0000 mL | Freq: Once | INTRAVENOUS | Status: AC
  Administered 2012-07-04: 1000 mL via INTRAVENOUS

## 2012-07-04 MED ORDER — FENTANYL CITRATE 0.05 MG/ML IJ SOLN
50.0000 ug | Freq: Once | INTRAMUSCULAR | Status: AC
  Administered 2012-07-04: 50 ug via INTRAVENOUS
  Filled 2012-07-04: qty 2

## 2012-07-04 MED ORDER — ONDANSETRON HCL 4 MG/2ML IJ SOLN
4.0000 mg | Freq: Once | INTRAMUSCULAR | Status: AC
  Administered 2012-07-04: 4 mg via INTRAVENOUS
  Filled 2012-07-04: qty 2

## 2012-07-04 NOTE — ED Notes (Signed)
Pt with acute onset emesis and diarrhea since 12 last night.  Since vomiting is experiencing bil back pain.

## 2012-07-04 NOTE — ED Notes (Signed)
Pt c/o vomiting and diarrhea that started last night around 11pm. Pt reports she has been able to keep fluids down. Pt c/o lower abd pain and lower back pain since last night.

## 2012-07-04 NOTE — ED Provider Notes (Signed)
History     CSN: [id number]  Arrival date & time 07/04/12  1207   None     Chief Complaint  Patient presents with  . Emesis  . Diarrhea    HPI Pt c/o vomiting and diarrhea that started last night around 11pm. Pt reports she has been able to keep fluids down. Pt c/o lower abd pain and lower back pain since last night.  Past Medical History  Diagnosis Date  . Injury of right rotator cuff     History reviewed. No pertinent past surgical history.  Family History  Problem Relation Age of Onset  . Cancer Other     History  Substance Use Topics  . Smoking status: Current Every Day Smoker -- 0.2 packs/day    Types: Cigarettes  . Smokeless tobacco: Not on file  . Alcohol Use: No    OB History    Grav Para Term Preterm Abortions TAB SAB Ect Mult Living                  Review of Systems All other systems reviewed and are negative Allergies  Review of patient's allergies indicates no known allergies.  Home Medications  No current outpatient prescriptions on file.  BP 100/60  Pulse 86  Temp 98.9 F (37.2 C) (Oral)  Resp 18  SpO2 96%  Physical Exam  Nursing note and vitals reviewed. Constitutional: She is oriented to person, place, and time. She appears well-developed and well-nourished. No distress.  HENT:  Head: Normocephalic and atraumatic.  Eyes: Pupils are equal, round, and reactive to light.  Neck: Normal range of motion.  Cardiovascular: Normal rate and intact distal pulses.   Pulmonary/Chest: No respiratory distress.  Abdominal: Normal appearance. She exhibits no distension. There is no tenderness. There is no rebound and no guarding.  Musculoskeletal: Normal range of motion.  Neurological: She is alert and oriented to person, place, and time. No cranial nerve deficit.  Skin: Skin is warm and dry. No rash noted.  Psychiatric: She has a normal mood and affect. Her behavior is normal.    ED Course  Procedures (including critical care  time)  Medications  sodium chloride 0.9 % bolus 1,000 mL (1000 mL Intravenous New Bag/Given 07/04/12 1400)  fentaNYL (SUBLIMAZE) injection 100 mcg (100 mcg Intravenous Given 07/04/12 1408)  ondansetron (ZOFRAN) injection 4 mg (4 mg Intravenous Given 07/04/12 1406)  fentaNYL (SUBLIMAZE) injection 50 mcg (50 mcg Intravenous Given 07/04/12 1618)    Labs Reviewed  CBC WITH DIFFERENTIAL - Abnormal; Notable for the following:    Neutrophils Relative 92 (*)     Lymphocytes Relative 5 (*)     Neutro Abs 7.8 (*)     Lymphs Abs 0.4 (*)     All other components within normal limits  COMPREHENSIVE METABOLIC PANEL - Abnormal; Notable for the following:    Glucose, Bld 119 (*)     Total Protein 8.4 (*)     AST 40 (*)     ALT 48 (*)     All other components within normal limits  URINALYSIS, ROUTINE W REFLEX MICROSCOPIC - Abnormal; Notable for the following:    Specific Gravity, Urine 1.034 (*)     Protein, ur 30 (*)     Leukocytes, UA SMALL (*)     All other components within normal limits  POCT PREGNANCY, URINE  URINE MICROSCOPIC-ADD ON   No results found.   1. Gastroenteritis       MDM  After treatment in  the ED the patient feels back to baseline and wants to go home.        Nelia Shi, MD 07/04/12 [phone]

## 2012-07-25 ENCOUNTER — Encounter (HOSPITAL_COMMUNITY): Payer: Self-pay | Admitting: Emergency Medicine

## 2012-07-25 ENCOUNTER — Emergency Department (HOSPITAL_COMMUNITY)
Admission: EM | Admit: 2012-07-25 | Discharge: 2012-07-25 | Disposition: A | Discharge status: Home / Self Care | Payer: Self-pay | Attending: Emergency Medicine | Admitting: Emergency Medicine

## 2012-07-25 DIAGNOSIS — Y939 Activity, unspecified: Secondary | ICD-10-CM | POA: Insufficient documentation

## 2012-07-25 DIAGNOSIS — S90569A Insect bite (nonvenomous), unspecified ankle, initial encounter: Secondary | ICD-10-CM | POA: Insufficient documentation

## 2012-07-25 DIAGNOSIS — F17200 Nicotine dependence, unspecified, uncomplicated: Secondary | ICD-10-CM | POA: Insufficient documentation

## 2012-07-25 DIAGNOSIS — Z87828 Personal history of other (healed) physical injury and trauma: Secondary | ICD-10-CM | POA: Insufficient documentation

## 2012-07-25 DIAGNOSIS — W57XXXA Bitten or stung by nonvenomous insect and other nonvenomous arthropods, initial encounter: Secondary | ICD-10-CM

## 2012-07-25 DIAGNOSIS — Y929 Unspecified place or not applicable: Secondary | ICD-10-CM | POA: Insufficient documentation

## 2012-07-25 DIAGNOSIS — R21 Rash and other nonspecific skin eruption: Secondary | ICD-10-CM | POA: Insufficient documentation

## 2012-07-25 MED ORDER — HYDROCORTISONE 1 % EX CREA: TOPICAL_CREAM | CUTANEOUS | Status: DC

## 2012-07-25 NOTE — ED Provider Notes (Signed)
History  Scribed for Denise Crumble, PA-C/ Joya Gaskins, MD, the patient was seen in room TR11C/TR11C. This chart was scribed by Candelaria Stagers. The patient's care started at 6:54 PM   CSN: [id number]  Arrival date & time 07/25/12  1754   First MD Initiated Contact with Patient 07/25/12 1805      Chief Complaint  Patient presents with  . Abscess     The history is provided by the patient. No language interpreter was used.   [name] is a 27 y.o. female who presents to the Emergency Department complaining of two itching bumps to the right ankle that she noticed about two days ago.  Pt also has a similar bump to the lower back.  She denies pain to the area.  Pt lives with her son and states that he does not have bumps.  She reports that she has been staying in a hotel over the last week.  Nothing seems to make the sx better or worse.       Past Medical History  Diagnosis Date  . Injury of right rotator cuff     History reviewed. No pertinent past surgical history.  Family History  Problem Relation Age of Onset  . Cancer Other     History  Substance Use Topics  . Smoking status: Current Every Day Smoker -- 0.2 packs/day    Types: Cigarettes  . Smokeless tobacco: Not on file  . Alcohol Use: No    OB History    Grav Para Term Preterm Abortions TAB SAB Ect Mult Living                  Review of Systems  Constitutional: Negative for fever and chills.  Respiratory: Negative for shortness of breath.   Gastrointestinal: Negative for nausea and vomiting.  Skin: Positive for rash (bumps to right ankle).  Neurological: Negative for weakness.  All other systems reviewed and are negative.    Allergies  Review of patient's allergies indicates no known allergies.  Home Medications  No current outpatient prescriptions on file.  BP 145/81  Pulse 86  Temp 98.3 F (36.8 C) (Oral)  Resp 18  SpO2 98%  Physical Exam  Nursing note and vitals  reviewed. Constitutional: She is oriented to person, place, and time. She appears well-developed and well-nourished. No distress.  HENT:  Head: Normocephalic and atraumatic.  Eyes: EOM are normal.  Neck: Neck supple. No tracheal deviation present.  Cardiovascular: Normal rate and regular rhythm.   Pulmonary/Chest: Effort normal and breath sounds normal. No respiratory distress. She has no wheezes. She has no rales.  Musculoskeletal: Normal range of motion.       Papule to posterior right lateral malleolus, to anterior lateral malleolus, and to top of right foot.  Area to the left mid back.    Neurological: She is alert and oriented to person, place, and time.  Skin: Skin is warm and dry.  Psychiatric: She has a normal mood and affect. Her behavior is normal.    ED Course  Procedures   DIAGNOSTIC STUDIES: Oxygen Saturation is 98% on room air, normal by my interpretation.    COORDINATION OF CARE:  6:58 PM Advised pt to thoroughly wash clothing and bedding.  Apply hydrocortisone.  Advised pt to return if sx become worse or she experiences a fever.  Pt understands and agrees.    Labs Reviewed - No data to display No results found.    1. Insect bites  MDM  Pt with rash to the back and ankle. Suspect insect/bug bites. Pt recently moved to a hotel. Question bedbugs. Will treat with topical hydrocortisone. Follow up. Instructed to wash all clothing, change bedding.    I personally performed the services described in this documentation, which was scribed in my presence. The recorded information has been reviewed and is accurate.       Lottie Mussel, PA 07/26/12 [phone]

## 2012-07-25 NOTE — ED Notes (Signed)
Pt presents with several bumps/reddened areas to back and right lateral ankle and right foot. States that ankle rash/bump appeared last Monday and rash to mid back and two other small bumps appeared this am. States that sites itch, not painful to the touch. Has not taken any medication for the rash/raised areas.

## 2012-07-25 NOTE — ED Notes (Signed)
Pt c/o right ankle abscess and small abscess on back; pt sts painful and swollen

## 2012-07-27 NOTE — ED Provider Notes (Signed)
Medical screening examination/treatment/procedure(s) were performed by non-physician practitioner and as supervising physician I was immediately available for consultation/collaboration.   Joya Gaskins, MD 07/27/12 1754

## 2013-01-07 ENCOUNTER — Emergency Department (HOSPITAL_COMMUNITY)
Admission: EM | Admit: 2013-01-07 | Discharge: 2013-01-07 | Disposition: A | Discharge status: Home / Self Care | Payer: Self-pay | Attending: Emergency Medicine | Admitting: Emergency Medicine

## 2013-01-07 ENCOUNTER — Encounter (HOSPITAL_COMMUNITY): Payer: Self-pay | Admitting: Emergency Medicine

## 2013-01-07 DIAGNOSIS — Y92009 Unspecified place in unspecified non-institutional (private) residence as the place of occurrence of the external cause: Secondary | ICD-10-CM | POA: Insufficient documentation

## 2013-01-07 DIAGNOSIS — Z8739 Personal history of other diseases of the musculoskeletal system and connective tissue: Secondary | ICD-10-CM | POA: Insufficient documentation

## 2013-01-07 DIAGNOSIS — M549 Dorsalgia, unspecified: Secondary | ICD-10-CM

## 2013-01-07 DIAGNOSIS — X503XXA Overexertion from repetitive movements, initial encounter: Secondary | ICD-10-CM | POA: Insufficient documentation

## 2013-01-07 DIAGNOSIS — Z79899 Other long term (current) drug therapy: Secondary | ICD-10-CM | POA: Insufficient documentation

## 2013-01-07 DIAGNOSIS — F17200 Nicotine dependence, unspecified, uncomplicated: Secondary | ICD-10-CM | POA: Insufficient documentation

## 2013-01-07 DIAGNOSIS — Y93E6 Activity, residential relocation: Secondary | ICD-10-CM | POA: Insufficient documentation

## 2013-01-07 DIAGNOSIS — M545 Low back pain, unspecified: Secondary | ICD-10-CM | POA: Insufficient documentation

## 2013-01-07 MED ORDER — TRAMADOL HCL 50 MG PO TABS: 50.0000 mg | ORAL_TABLET | Freq: Four times a day (QID) | ORAL | Status: DC | PRN

## 2013-01-07 NOTE — ED Provider Notes (Signed)
History  This chart was scribed for Denise Sites, PA-C working with Candyce Churn, MD by Greggory Stallion, ED scribe. This patient was seen in room TR06C/TR06C and the patient's care was started at 3:04 PM.  CSN: [id number] Arrival date & time 01/07/13  1455   Chief Complaint  Patient presents with  . Back Pain    Low back   The history is provided by the patient. No language interpreter was used.    HPI Comments: DESTENEE Ponce is a 27 y.o. female who presents to the Emergency Department complaining of gradual onset, constant lower back pain that started yesterday, described as a dull ache. Pt states she was helping her mom move yesterday when it started. She states certain movements like bending over and twisting worsen the pain. Pt states she has a h/o back pain. Pt states she has not taken anything for the pain. Pt denies numbness and tingling as associated symptoms.  No loss of bowel or bladder function.  Has hydrocodone at home but cannot take this during the day while working or driving.  Past Medical History  Diagnosis Date  . Injury of right rotator cuff    History reviewed. No pertinent past surgical history. Family History  Problem Relation Age of Onset  . Cancer Other    History  Substance Use Topics  . Smoking status: Current Every Day Smoker -- 0.25 packs/day    Types: Cigarettes  . Smokeless tobacco: Not on file  . Alcohol Use: Yes     Comment: social   OB History   Grav Para Term Preterm Abortions TAB SAB Ect Mult Living                 Review of Systems  Musculoskeletal: Positive for back pain.  All other systems reviewed and are negative.    Allergies  Review of patient's allergies indicates no known allergies.  Home Medications   Current Outpatient Rx  Name  Route  Sig  Dispense  Refill  . hydrocortisone cream 1 %      Apply to affected area 2 times daily   15 g   0    BP 113/58  Pulse 68  Temp(Src) 98.3 F (36.8 C) (Oral)  Resp 16   Ht 5\' 4"  (1.626 m)  Wt 150 lb (68.04 kg)  BMI 25.73 kg/m2  SpO2 100%  Physical Exam  Nursing note and vitals reviewed. Constitutional: She is oriented to person, place, and time. She appears well-developed and well-nourished.  HENT:  Head: Normocephalic and atraumatic.  Eyes: Conjunctivae and EOM are normal. Pupils are equal, round, and reactive to light.  Neck: Normal range of motion. Neck supple.  Cardiovascular: Normal rate, regular rhythm and normal heart sounds.   Pulmonary/Chest: Effort normal and breath sounds normal.  Musculoskeletal: Normal range of motion.       Lumbar back: She exhibits tenderness and pain. She exhibits normal range of motion, no bony tenderness, no swelling, no edema, no deformity, no laceration, no spasm and normal pulse.       Back:  LS without midline tenderness, no deformity or signs of trauma, distal sensation intact, normal gait  Neurological: She is alert and oriented to person, place, and time.  Skin: Skin is warm and dry.  Psychiatric: She has a normal mood and affect.    ED Course  Procedures (including critical care time)  DIAGNOSTIC STUDIES: Oxygen Saturation is 100% on RA, normal by my interpretation.    COORDINATION  OF CARE: 3:23 PM-Discussed treatment plan which includes pain meds with pt at bedside and pt agreed to plan.   Labs Reviewed - No data to display No results found.  1. Back pain     MDM   No trauma or injury-- imaging deferred. No concern for cauda equina. Patient states she has hydrocodone at home for prior dental pain but she cannot take this during the day while working. Rx tramadol day, take hydrocodone at night. Discussed plan with patient, she agreed. Return precautions advised.  I personally performed the services described in this documentation, which was scribed in my presence. The recorded information has been reviewed and is accurate.   Garlon Hatchet, PA-C 01/07/13 1545

## 2013-01-07 NOTE — ED Notes (Signed)
Pt c/o low back pain onset yesterday. Pt reports helping her mom move yesterday. Pt able to move all extremities.

## 2013-01-10 NOTE — ED Provider Notes (Signed)
Medical screening examination/treatment/procedure(s) were performed by non-physician practitioner and as supervising physician I was immediately available for consultation/collaboration.  Candyce Churn, MD 01/10/13 1110

## 2013-03-04 ENCOUNTER — Emergency Department (HOSPITAL_COMMUNITY)
Admission: EM | Admit: 2013-03-04 | Discharge: 2013-03-04 | Disposition: A | Discharge status: Home / Self Care | Payer: Self-pay | Attending: Emergency Medicine | Admitting: Emergency Medicine

## 2013-03-04 ENCOUNTER — Encounter (HOSPITAL_COMMUNITY): Payer: Self-pay | Admitting: Emergency Medicine

## 2013-03-04 DIAGNOSIS — R109 Unspecified abdominal pain: Secondary | ICD-10-CM

## 2013-03-04 DIAGNOSIS — R1031 Right lower quadrant pain: Secondary | ICD-10-CM | POA: Insufficient documentation

## 2013-03-04 DIAGNOSIS — Z3202 Encounter for pregnancy test, result negative: Secondary | ICD-10-CM | POA: Insufficient documentation

## 2013-03-04 DIAGNOSIS — J029 Acute pharyngitis, unspecified: Secondary | ICD-10-CM | POA: Insufficient documentation

## 2013-03-04 DIAGNOSIS — Z87828 Personal history of other (healed) physical injury and trauma: Secondary | ICD-10-CM | POA: Insufficient documentation

## 2013-03-04 DIAGNOSIS — F17200 Nicotine dependence, unspecified, uncomplicated: Secondary | ICD-10-CM | POA: Insufficient documentation

## 2013-03-04 DIAGNOSIS — Z87440 Personal history of urinary (tract) infections: Secondary | ICD-10-CM | POA: Insufficient documentation

## 2013-03-04 DIAGNOSIS — N898 Other specified noninflammatory disorders of vagina: Secondary | ICD-10-CM | POA: Insufficient documentation

## 2013-03-04 LAB — CBC WITH DIFFERENTIAL/PLATELET
Lymphocytes Relative: 31 % (ref 12–46)
Lymphs Abs: 2.1 10*3/uL (ref 0.7–4.0)
MCV: 88.8 fL (ref 78.0–100.0)
Neutrophils Relative %: 60 % (ref 43–77)
Platelets: ADEQUATE 10*3/uL (ref 150–400)
RBC: 4.45 MIL/uL (ref 3.87–5.11)
WBC: 6.8 10*3/uL (ref 4.0–10.5)

## 2013-03-04 LAB — COMPREHENSIVE METABOLIC PANEL
ALT: 13 U/L (ref 0–35)
Alkaline Phosphatase: 51 U/L (ref 39–117)
CO2: 23 mEq/L (ref 19–32)
GFR calc Af Amer: >90 mL/min (ref >90)
GFR calc non Af Amer: >90 mL/min (ref >90)
Glucose, Bld: 79 mg/dL (ref 70–99)
Potassium: 3.4 mEq/L — ABNORMAL LOW (ref 3.5–5.1)
Sodium: 136 mEq/L (ref 135–145)

## 2013-03-04 LAB — URINALYSIS, ROUTINE W REFLEX MICROSCOPIC
Bilirubin Urine: NEGATIVE
Ketones, ur: NEGATIVE mg/dL
Nitrite: NEGATIVE
Urobilinogen, UA: 1 mg/dL (ref 0.0–1.0)
pH: 6.5 (ref 5.0–8.0)

## 2013-03-04 LAB — WET PREP, GENITAL: Trich, Wet Prep: NONE SEEN

## 2013-03-04 NOTE — ED Notes (Addendum)
C/o RLQ pain x 1 week.  Denies nausea, vomiting, diarrhea, or urinary complaint.  LMP 9/9- only lasted 3 days and was only light amount of brown discharge.  Also reports sore itchy throat since yesterday. Pt's son also here with sore throat and known exposure to family member with strep throat.

## 2013-03-04 NOTE — ED Notes (Signed)
Rob B, PA at bedside  Pt and ped pts mother at beside states she and son have been around family who have had strep throat  Pt states she has abdominal pain and states feeling "weird feeling" for about a week and a half. Pt states she has been around people with a virus of diarrhea. Pt denies having diarrhea. Pt c/o sore throat as well and states "just doesn't feel right" when abdomen is palpated. Pt states she has had abnormal d/c.

## 2013-03-04 NOTE — ED Notes (Signed)
Pt alert and mentating appropriately upon d/c. Pt given d/c teaching and follow up care instructions. Pt verbalizes understanding of d/c teaching and has no further questions upon d.c. Pt ambulatory with steady gait leaving ER with son, peds pt. NAD noted upon d/c.

## 2013-03-04 NOTE — ED Provider Notes (Signed)
CSN: [id number]     Arrival date & time 03/04/13  1701 History   First MD Initiated Contact with Patient 03/04/13 1724     Chief Complaint  Patient presents with  . Sore Throat  . Abdominal Pain   (Consider location/radiation/quality/duration/timing/severity/associated sxs/prior Treatment) HPI Comments: Patient presents emergency department with chief complaint of abdominal pain. She states that she has had right lower quadrant pain for the past month. She states that she is scheduled to see her primary care doctor in 2 more weeks. She states that she's also been having a sore throat. She is here with her son, who is also thinking about it for sore throat, so she wanted to be evaluated for her abdominal pain as well. Patient states the pain is intermittent in nature, but can be as high as an 8/10. Currently is mild. She also endorses irregular periods as of late. She endorses new vaginal discharge, but denies any dysuria. She denies fevers, chills, chest pain, shortness of breath, nausea, vomiting, diarrhea, or constipation. She has not tried anything to alleviate her symptoms.  The history is provided by the patient. No language interpreter was used.    Past Medical History  Diagnosis Date  . Injury of right rotator cuff   . Kidney infection    History reviewed. No pertinent past surgical history. Family History  Problem Relation Age of Onset  . Cancer Other    History  Substance Use Topics  . Smoking status: Current Every Day Smoker -- 0.25 packs/day    Types: Cigarettes  . Smokeless tobacco: Not on file  . Alcohol Use: Yes     Comment: social   OB History   Grav Para Term Preterm Abortions TAB SAB Ect Mult Living                 Review of Systems  All other systems reviewed and are negative.    Allergies  Review of patient's allergies indicates no known allergies.  Home Medications   Current Outpatient Rx  Name  Route  Sig  Dispense  Refill  . medroxyPROGESTERone  (DEPO-PROVERA) 150 MG/ML injection   Intramuscular   Inject 150 mg into the muscle every 3 (three) months.         . traMADol (ULTRAM) 50 MG tablet   Oral   Take 1 tablet (50 mg total) by mouth every 6 (six) hours as needed for pain.   15 tablet   0    BP 123/59  Pulse 80  Temp(Src) 98.6 F (37 C) (Oral)  Resp 18  SpO2 99%  LMP 02/27/2013 Physical Exam  Nursing note and vitals reviewed. Constitutional: She is oriented to person, place, and time. She appears well-developed and well-nourished.  HENT:  Head: Normocephalic and atraumatic.  Eyes: Conjunctivae and EOM are normal. Pupils are equal, round, and reactive to light.  Neck: Normal range of motion. Neck supple.  Cardiovascular: Normal rate and regular rhythm.  Exam reveals no gallop and no friction rub.   No murmur heard. Pulmonary/Chest: Effort normal and breath sounds normal. No respiratory distress. She has no wheezes. She has no rales. She exhibits no tenderness.  Abdominal: Soft. Bowel sounds are normal. She exhibits no distension and no mass. There is no tenderness. There is no rebound and no guarding.  No focal abdominal tenderness, no signs of surgical or acute abdomen, no fluid wave, or signs of peritonitis  Genitourinary:  Chaperone present during exam  Musculoskeletal: Normal range of motion. She  exhibits no edema and no tenderness.  Neurological: She is alert and oriented to person, place, and time.  Skin: Skin is warm and dry.  Psychiatric: She has a normal mood and affect. Her behavior is normal. Judgment and thought content normal.    ED Course  Procedures (including critical care time) Labs Review Labs Reviewed  RAPID STREP SCREEN  CBC WITH DIFFERENTIAL  COMPREHENSIVE METABOLIC PANEL  URINALYSIS, ROUTINE W REFLEX MICROSCOPIC   Results for orders placed during the hospital encounter of 03/04/13  RAPID STREP SCREEN      Result Value Range   Streptococcus, Group A Screen (Direct) NEGATIVE  NEGATIVE   WET PREP, GENITAL      Result Value Range   Yeast Wet Prep HPF POC NONE SEEN  NONE SEEN   Trich, Wet Prep NONE SEEN  NONE SEEN   Clue Cells Wet Prep HPF POC NONE SEEN  NONE SEEN   WBC, Wet Prep HPF POC FEW (*) NONE SEEN  CBC WITH DIFFERENTIAL      Result Value Range   WBC 6.8  4.0 - 10.5 K/uL   RBC 4.45  3.87 - 5.11 MIL/uL   Hemoglobin 13.4  12.0 - 15.0 g/dL   HCT 16.1  09.6 - 04.5 %   MCV 88.8  78.0 - 100.0 fL   MCH 30.1  26.0 - 34.0 pg   MCHC 33.9  30.0 - 36.0 g/dL   RDW 40.9  81.1 - 91.4 %   Platelets    150 - 400 K/uL   Value: PLATELET CLUMPS NOTED ON SMEAR, COUNT APPEARS ADEQUATE   Neutrophils Relative % 60  43 - 77 %   Neutro Abs 4.1  1.7 - 7.7 K/uL   Lymphocytes Relative 31  12 - 46 %   Lymphs Abs 2.1  0.7 - 4.0 K/uL   Monocytes Relative 8  3 - 12 %   Monocytes Absolute 0.5  0.1 - 1.0 K/uL   Eosinophils Relative 2  0 - 5 %   Eosinophils Absolute 0.1  0.0 - 0.7 K/uL   Basophils Relative 0  0 - 1 %   Basophils Absolute 0.0  0.0 - 0.1 K/uL  COMPREHENSIVE METABOLIC PANEL      Result Value Range   Sodium 136  135 - 145 mEq/L   Potassium 3.4 (*) 3.5 - 5.1 mEq/L   Chloride 100  96 - 112 mEq/L   CO2 23  19 - 32 mEq/L   Glucose, Bld 79  70 - 99 mg/dL   BUN 8  6 - 23 mg/dL   Creatinine, Ser 7.82  0.50 - 1.10 mg/dL   Calcium 9.5  8.4 - 95.6 mg/dL   Total Protein 7.5  6.0 - 8.3 g/dL   Albumin 3.9  3.5 - 5.2 g/dL   AST 17  0 - 37 U/L   ALT 13  0 - 35 U/L   Alkaline Phosphatase 51  39 - 117 U/L   Total Bilirubin 0.3  0.3 - 1.2 mg/dL   GFR calc non Af Amer >90  >90 mL/min   GFR calc Af Amer >90  >90 mL/min  URINALYSIS, ROUTINE W REFLEX MICROSCOPIC      Result Value Range   Color, Urine YELLOW  YELLOW   APPearance CLEAR  CLEAR   Specific Gravity, Urine 1.027  1.005 - 1.030   pH 6.5  5.0 - 8.0   Glucose, UA NEGATIVE  NEGATIVE mg/dL   Hgb urine dipstick NEGATIVE  NEGATIVE  Bilirubin Urine NEGATIVE  NEGATIVE   Ketones, ur NEGATIVE  NEGATIVE mg/dL   Protein, ur NEGATIVE   NEGATIVE mg/dL   Urobilinogen, UA 1.0  0.0 - 1.0 mg/dL   Nitrite NEGATIVE  NEGATIVE   Leukocytes, UA NEGATIVE  NEGATIVE  POCT PREGNANCY, URINE      Result Value Range   Preg Test, Ur NEGATIVE  NEGATIVE   No results found.    MDM   1. Abdominal pain   2. Sore throat     Patient with vague abdominal pain times one month, no focal tenderness on exam, doubt acute abdomen, will perform pelvic exam, and routine labs, will reevaluate.  6:58 PM Abdomen remains benign. Labs and workup are unremarkable. Will discharge to home with primary care followup. Specific return precautions have been given to the patient, and she understands and agrees with the plan. She is stable and ready for discharge.  Patient discussed with Dr. Criss Alvine, who agrees with the plan.   Roxy Horseman, PA-C 03/04/13 1858

## 2013-03-05 NOTE — ED Provider Notes (Signed)
Medical screening examination/treatment/procedure(s) were performed by non-physician practitioner and as supervising physician I was immediately available for consultation/collaboration.   Audree Camel, MD 03/05/13 [phone]

## 2013-03-06 LAB — CULTURE, GROUP A STREP

## 2013-03-06 LAB — GC/CHLAMYDIA PROBE AMP
CT Probe RNA: NEGATIVE
GC Probe RNA: NEGATIVE

## 2013-07-03 ENCOUNTER — Emergency Department (HOSPITAL_COMMUNITY): Payer: No Typology Code available for payment source

## 2013-07-03 ENCOUNTER — Emergency Department (HOSPITAL_COMMUNITY)
Admission: EM | Admit: 2013-07-03 | Discharge: 2013-07-03 | Disposition: A | Discharge status: Home / Self Care | Payer: No Typology Code available for payment source | Attending: Emergency Medicine | Admitting: Emergency Medicine

## 2013-07-03 ENCOUNTER — Encounter (HOSPITAL_COMMUNITY): Payer: Self-pay | Admitting: Emergency Medicine

## 2013-07-03 DIAGNOSIS — N83209 Unspecified ovarian cyst, unspecified side: Secondary | ICD-10-CM | POA: Insufficient documentation

## 2013-07-03 DIAGNOSIS — Z8739 Personal history of other diseases of the musculoskeletal system and connective tissue: Secondary | ICD-10-CM | POA: Insufficient documentation

## 2013-07-03 DIAGNOSIS — N949 Unspecified condition associated with female genital organs and menstrual cycle: Secondary | ICD-10-CM | POA: Insufficient documentation

## 2013-07-03 DIAGNOSIS — F17200 Nicotine dependence, unspecified, uncomplicated: Secondary | ICD-10-CM | POA: Insufficient documentation

## 2013-07-03 DIAGNOSIS — Z87440 Personal history of urinary (tract) infections: Secondary | ICD-10-CM | POA: Insufficient documentation

## 2013-07-03 DIAGNOSIS — Z3202 Encounter for pregnancy test, result negative: Secondary | ICD-10-CM | POA: Insufficient documentation

## 2013-07-03 LAB — WET PREP, GENITAL
Clue Cells Wet Prep HPF POC: NONE SEEN
Trich, Wet Prep: NONE SEEN
Yeast Wet Prep HPF POC: NONE SEEN

## 2013-07-03 LAB — URINALYSIS, ROUTINE W REFLEX MICROSCOPIC
BILIRUBIN URINE: NEGATIVE
Glucose, UA: NEGATIVE mg/dL
Hgb urine dipstick: NEGATIVE
KETONES UR: 15 mg/dL — AB
LEUKOCYTES UA: NEGATIVE
NITRITE: NEGATIVE
PH: 5.5 (ref 5.0–8.0)
Protein, ur: NEGATIVE mg/dL
SPECIFIC GRAVITY, URINE: 1.03 (ref 1.005–1.030)
UROBILINOGEN UA: 0.2 mg/dL (ref 0.0–1.0)

## 2013-07-03 LAB — POCT PREGNANCY, URINE: PREG TEST UR: NEGATIVE

## 2013-07-03 MED ORDER — DOXYCYCLINE HYCLATE 100 MG PO CAPS: 100.0000 mg | ORAL_CAPSULE | Freq: Two times a day (BID) | ORAL | Status: DC

## 2013-07-03 MED ORDER — AZITHROMYCIN 250 MG PO TABS
1000.0000 mg | ORAL_TABLET | Freq: Once | ORAL | Status: AC
  Administered 2013-07-03: 1000 mg via ORAL
  Filled 2013-07-03: qty 4

## 2013-07-03 MED ORDER — OXYCODONE-ACETAMINOPHEN 5-325 MG PO TABS
2.0000 | ORAL_TABLET | Freq: Once | ORAL | Status: AC
  Administered 2013-07-03: 2 via ORAL
  Filled 2013-07-03: qty 2

## 2013-07-03 MED ORDER — ONDANSETRON 4 MG PO TBDP
8.0000 mg | ORAL_TABLET | Freq: Once | ORAL | Status: AC
  Administered 2013-07-03: 8 mg via ORAL
  Filled 2013-07-03: qty 2

## 2013-07-03 MED ORDER — LIDOCAINE HCL (PF) 1 % IJ SOLN
INTRAMUSCULAR | Status: AC
  Administered 2013-07-03: 0.9 mL
  Filled 2013-07-03: qty 5

## 2013-07-03 MED ORDER — PROMETHAZINE HCL 25 MG PO TABS: 25.0000 mg | ORAL_TABLET | Freq: Four times a day (QID) | ORAL | Status: DC | PRN

## 2013-07-03 MED ORDER — OXYCODONE-ACETAMINOPHEN 5-325 MG PO TABS: 1.0000 | ORAL_TABLET | ORAL | Status: DC | PRN

## 2013-07-03 MED ORDER — CEFTRIAXONE SODIUM 250 MG IJ SOLR
250.0000 mg | Freq: Once | INTRAMUSCULAR | Status: AC
  Administered 2013-07-03: 250 mg via INTRAMUSCULAR
  Filled 2013-07-03: qty 250

## 2013-07-03 NOTE — ED Notes (Signed)
Pt reports was having intercourse with boyfriend and during intercourse began to have sharp pain to abdomen, shooting up to her rib cage. Has had cyst in ovary, but states pain different now. No vaginal bleeding.

## 2013-07-03 NOTE — ED Provider Notes (Signed)
CSN: [id number]     Arrival date & time 07/03/13  1151 History   First MD Initiated Contact with Patient 07/03/13 1207     Chief Complaint  Patient presents with  . Abdominal Pain  . Vaginal Pain   (Consider location/radiation/quality/duration/timing/severity/associated sxs/prior Treatment) HPI Comments: Patient is a 28 year old female with history of ovarian cyst who presents today with severe, right-sided abdominal pain. She reports she was having intercourse when the pain began. It is a cramping pain. She was unable to find any comfortable position and had to stop having intercourse. The pain radiated into her ribs. She denies any vaginal discharge or vaginal bleeding. Her last menstrual period was one month ago.   Patient is a 28 y.o. female presenting with abdominal pain and vaginal pain. The history is provided by the patient. No language interpreter was used.  Abdominal Pain Associated symptoms: no chest pain, no chills, no fever, no shortness of breath, no vaginal bleeding, no vaginal discharge and no vomiting   Vaginal Pain Associated symptoms include abdominal pain. Pertinent negatives include no chest pain, chills, fever or vomiting.    Past Medical History  Diagnosis Date  . Injury of right rotator cuff   . Kidney infection    History reviewed. No pertinent past surgical history. Family History  Problem Relation Age of Onset  . Cancer Other    History  Substance Use Topics  . Smoking status: Current Every Day Smoker -- 0.25 packs/day    Types: Cigarettes  . Smokeless tobacco: Not on file  . Alcohol Use: Yes     Comment: social   OB History   Grav Para Term Preterm Abortions TAB SAB Ect Mult Living                 Review of Systems  Constitutional: Negative for fever and chills.  Respiratory: Negative for shortness of breath.   Cardiovascular: Negative for chest pain.  Gastrointestinal: Positive for abdominal pain. Negative for vomiting.  Genitourinary:  Positive for vaginal pain and dyspareunia. Negative for vaginal bleeding and vaginal discharge.  All other systems reviewed and are negative.    Allergies  Review of patient's allergies indicates no known allergies.  Home Medications   Current Outpatient Rx  Name  Route  Sig  Dispense  Refill  . medroxyPROGESTERone (DEPO-PROVERA) 150 MG/ML injection   Intramuscular   Inject 150 mg into the muscle every 3 (three) months. Last injection mid July 2014          BP 135/72  Pulse 91  Temp(Src) 98.1 F (36.7 C) (Oral)  Resp 16  SpO2 100% Physical Exam  Nursing note and vitals reviewed. Constitutional: She is oriented to person, place, and time. She appears well-developed and well-nourished. No distress.  Patient appears comfortable, texting on phone.   HENT:  Head: Normocephalic and atraumatic.  Right Ear: External ear normal.  Left Ear: External ear normal.  Nose: Nose normal.  Mouth/Throat: Oropharynx is clear and moist.  Eyes: Conjunctivae are normal.  Neck: Normal range of motion.  Cardiovascular: Normal rate, regular rhythm and normal heart sounds.   Pulmonary/Chest: Effort normal and breath sounds normal. No stridor. No respiratory distress. She has no wheezes. She has no rales.  Abdominal: Soft. Bowel sounds are normal. She exhibits no distension. There is tenderness in the suprapubic area. There is no rigidity, no rebound and no guarding.  Genitourinary: There is no rash, tenderness, lesion or injury on the right labia. There is no rash, tenderness,  lesion or injury on the left labia. Cervix exhibits motion tenderness and discharge. Cervix exhibits no friability. Right adnexum displays tenderness. Left adnexum displays no tenderness.  White discharge from cervical os.   Musculoskeletal: Normal range of motion.  Neurological: She is alert and oriented to person, place, and time. She has normal strength.  Skin: Skin is warm and dry. She is not diaphoretic. No erythema.   Psychiatric: She has a normal mood and affect. Her behavior is normal.    ED Course  Procedures (including critical care time) Labs Review Labs Reviewed  WET PREP, GENITAL - Abnormal; Notable for the following:    WBC, Wet Prep HPF POC FEW (*)    All other components within normal limits  URINALYSIS, ROUTINE W REFLEX MICROSCOPIC - Abnormal; Notable for the following:    Ketones, ur 15 (*)    All other components within normal limits  GC/CHLAMYDIA PROBE AMP  POCT PREGNANCY, URINE   Imaging Review US Transvaginal Non-ob  07/03/2013   CLINICAL DATA:  Right-sided pain. Evaluate for possibility of ovarian torsion.  EXAM: TRANSABDOMINAL AND TRANSVAGINAL ULTRASOUND OF PELVIS  DOPPLER ULTRASOUND OF OVARIES  TECHNIQUE: Both transabdominal and transvaginal ultrasound examinations of the pelvis were performed. Transabdominal technique was performed for global imaging of the pelvis including uterus, ovaries, adnexal regions, and pelvic cul-de-sac.  It was necessary to proceed with endovaginal exam following the transabdominal exam to visualize the . Color and duplex Doppler ultrasound was utilized to evaluate blood flow to the ovaries.  COMPARISON:  None.  11/03/2011  FINDINGS: Uterus  Measurements: 8.5 x 4.4 x 5.3 cm. No fibroids or other mass visualized.  Endometrium  Thickness: 3.0 mm.  No focal abnormality visualized.  Right ovary  Measurements: 5.1 x 2.7 x 4.8 cm. Dominant complex hypoechoic structure measuring 3.6 x 1.7 x 3.2 cm.  Left ovary  Measurements: 3.2 x 1.8 x 3.1 cm. Normal appearance/no adnexal mass.  Pulsed Doppler evaluation of both ovaries demonstrates normal low-resistance arterial and venous waveforms.  Other findings  Small amount of free fluid.  IMPRESSION: Within the right ovary there is a dominant complex hypoechoic 3.6 x 1.7 x 3.2 cm structure. It is possible this represents an involuting hemorrhagic cyst however, follow-up ultrasound in 2 months after 2 menstrual cycles (whichever  is first) recommended.  Currently flow is noted to both ovaries without evidence of torsion.   Electronically Signed   By: Bridgett Larsson M.D.   On: 07/03/2013 14:39   US Pelvis Complete  07/03/2013   CLINICAL DATA:  Right-sided pain. Evaluate for possibility of ovarian torsion.  EXAM: TRANSABDOMINAL AND TRANSVAGINAL ULTRASOUND OF PELVIS  DOPPLER ULTRASOUND OF OVARIES  TECHNIQUE: Both transabdominal and transvaginal ultrasound examinations of the pelvis were performed. Transabdominal technique was performed for global imaging of the pelvis including uterus, ovaries, adnexal regions, and pelvic cul-de-sac.  It was necessary to proceed with endovaginal exam following the transabdominal exam to visualize the . Color and duplex Doppler ultrasound was utilized to evaluate blood flow to the ovaries.  COMPARISON:  None.  11/03/2011  FINDINGS: Uterus  Measurements: 8.5 x 4.4 x 5.3 cm. No fibroids or other mass visualized.  Endometrium  Thickness: 3.0 mm.  No focal abnormality visualized.  Right ovary  Measurements: 5.1 x 2.7 x 4.8 cm. Dominant complex hypoechoic structure measuring 3.6 x 1.7 x 3.2 cm.  Left ovary  Measurements: 3.2 x 1.8 x 3.1 cm. Normal appearance/no adnexal mass.  Pulsed Doppler evaluation of both ovaries demonstrates normal low-resistance  arterial and venous waveforms.  Other findings  Small amount of free fluid.  IMPRESSION: Within the right ovary there is a dominant complex hypoechoic 3.6 x 1.7 x 3.2 cm structure. It is possible this represents an involuting hemorrhagic cyst however, follow-up ultrasound in 2 months after 2 menstrual cycles (whichever is first) recommended.  Currently flow is noted to both ovaries without evidence of torsion.   Electronically Signed   By: Bridgett LarssonSteve  Olson M.D.   On: 07/03/2013 14:39   [place] Art/ven Flow Abd Pelv Doppler  07/03/2013   CLINICAL DATA:  Right-sided pain. Evaluate for possibility of ovarian torsion.  EXAM: TRANSABDOMINAL AND TRANSVAGINAL ULTRASOUND OF PELVIS   DOPPLER ULTRASOUND OF OVARIES  TECHNIQUE: Both transabdominal and transvaginal ultrasound examinations of the pelvis were performed. Transabdominal technique was performed for global imaging of the pelvis including uterus, ovaries, adnexal regions, and pelvic cul-de-sac.  It was necessary to proceed with endovaginal exam following the transabdominal exam to visualize the . Color and duplex Doppler ultrasound was utilized to evaluate blood flow to the ovaries.  COMPARISON:  None.  11/03/2011  FINDINGS: Uterus  Measurements: 8.5 x 4.4 x 5.3 cm. No fibroids or other mass visualized.  Endometrium  Thickness: 3.0 mm.  No focal abnormality visualized.  Right ovary  Measurements: 5.1 x 2.7 x 4.8 cm. Dominant complex hypoechoic structure measuring 3.6 x 1.7 x 3.2 cm.  Left ovary  Measurements: 3.2 x 1.8 x 3.1 cm. Normal appearance/no adnexal mass.  Pulsed Doppler evaluation of both ovaries demonstrates normal low-resistance arterial and venous waveforms.  Other findings  Small amount of free fluid.  IMPRESSION: Within the right ovary there is a dominant complex hypoechoic 3.6 x 1.7 x 3.2 cm structure. It is possible this represents an involuting hemorrhagic cyst however, follow-up ultrasound in 2 months after 2 menstrual cycles (whichever is first) recommended.  Currently flow is noted to both ovaries without evidence of torsion.   Electronically Signed   By: Bridgett LarssonSteve  Olson M.D.   On: 07/03/2013 14:39    EKG Interpretation   None       MDM   1. Hemorrhagic ovarian cyst    Patient presents to emergency department with a sudden onset pelvic pain. Ultrasound shows hemorrhagic cyst. I discussed with patient that she will need a followup ultrasound in 2 months or after 2 mental cycles, whichever comes first. She feels significantly improved after Percocet given in the emergency department. On her pelvic exam she did have cervical motion tenderness and there is a concern for pelvic inflammatory disease. She was given  250mg  of rocephin and 1000mg  of azithromycin in the ED. Will d/c with doxycycline. Patient is hemodynamically stable with no complaints prior to discharge. Discussed specific reasons to return to the emergency department. Patient / Family / Caregiver informed of clinical course, understand medical decision-making process, and agree with plan.     Mora BellmanHannah S Azarian Starace, PA-C 07/03/13 [phone]

## 2013-07-03 NOTE — Discharge Instructions (Signed)
Today you were diagnosed with a hemorrhagic ovarian cyst. You need a follow up ultrasound in 2 months or after 2 menstrual cycles (whichever comes first). Please find a gynecologist in the area or [facility]. You were also very tender on your pelvic exam and are being treated for PID. Please finish the entire course of your antibiotics. Please return to the emergency department if you develop a fever, severe nausea and vomiting, or your symptoms worsen in any way that is concerning to you.   Ovarian Cyst An ovarian cyst is a sac filled with fluid or blood. This sac is attached to the ovary. Some cysts go away on their own. Other cysts need treatment.  HOME CARE   Only take medicine as told by your doctor.  Follow up with your doctor as told.  Get regular pelvic exams and Pap tests. GET HELP IF:  Your periods are late, not regular, or painful.  You stop having periods.  Your belly (abdominal) or pelvic pain does not go away.  Your belly becomes large or puffy (swollen).  You have a hard time peeing (totally emptying your bladder).  You have pressure on your bladder.  You have pain during sex.  You feel fullness, pressure, or discomfort in your belly.  You lose weight for no reason.  You feel sick most of the time.  You have a hard time pooping (constipation).  You do not feel like eating.  You develop pimples (acne).  You have an increase in hair on your body and face.  You are gaining weight for no reason.  You think you are pregnant. GET HELP RIGHT AWAY IF:   Your belly pain gets worse.  You feel sick to your stomach (nauseous), and you throw up (vomit).  You have a fever that comes on fast.  You have belly pain while pooping (bowel movement).  Your periods are heavier than usual. MAKE SURE YOU:   Understand these instructions.  Will watch your condition.  Will get help right away if you are not doing well or get worse. Document Released:  11/24/2007 Document Revised: 03/28/2013 Document Reviewed: 02/12/2013 [facility]Exit[facility] Patient Information 2014 [place], [place]LL[place].

## 2013-07-04 ENCOUNTER — Telehealth (HOSPITAL_COMMUNITY): Payer: Self-pay

## 2013-07-04 LAB — GC/CHLAMYDIA PROBE AMP
CT Probe RNA: NEGATIVE
GC PROBE AMP APTIMA: NEGATIVE

## 2013-07-04 NOTE — ED Provider Notes (Signed)
Medical screening examination/treatment/procedure(s) were performed by non-physician practitioner and as supervising physician I was immediately available for consultation/collaboration.  EKG Interpretation   None         Dagmar HaitWilliam Rodd Heft, MD 07/04/13 [phone]

## 2013-07-04 NOTE — ED Notes (Signed)
Pt wanting to change work note to return 07/05/13 instead of 07/04/13  R. Albert PA consulted Pt needs to f/u as needed, work note not changed.

## 2013-09-04 ENCOUNTER — Other Ambulatory Visit: Payer: Self-pay | Admitting: Nurse Practitioner

## 2013-09-04 DIAGNOSIS — N83209 Unspecified ovarian cyst, unspecified side: Secondary | ICD-10-CM

## 2013-09-10 ENCOUNTER — Ambulatory Visit
Admission: RE | Admit: 2013-09-10 | Discharge: 2013-09-10 | Disposition: A | Discharge status: Home / Self Care | Payer: No Typology Code available for payment source | Source: Ambulatory Visit | Attending: Nurse Practitioner | Admitting: Nurse Practitioner

## 2013-09-10 DIAGNOSIS — N83209 Unspecified ovarian cyst, unspecified side: Secondary | ICD-10-CM

## 2013-12-05 ENCOUNTER — Emergency Department (HOSPITAL_COMMUNITY)
Admission: EM | Admit: 2013-12-05 | Discharge: 2013-12-05 | Disposition: A | Discharge status: Home / Self Care | Payer: No Typology Code available for payment source | Attending: Emergency Medicine | Admitting: Emergency Medicine

## 2013-12-05 ENCOUNTER — Encounter (HOSPITAL_COMMUNITY): Payer: Self-pay | Admitting: Emergency Medicine

## 2013-12-05 ENCOUNTER — Emergency Department (HOSPITAL_COMMUNITY): Payer: No Typology Code available for payment source

## 2013-12-05 DIAGNOSIS — N159 Renal tubulo-interstitial disease, unspecified: Secondary | ICD-10-CM | POA: Insufficient documentation

## 2013-12-05 DIAGNOSIS — Z8739 Personal history of other diseases of the musculoskeletal system and connective tissue: Secondary | ICD-10-CM | POA: Insufficient documentation

## 2013-12-05 DIAGNOSIS — Z792 Long term (current) use of antibiotics: Secondary | ICD-10-CM | POA: Insufficient documentation

## 2013-12-05 DIAGNOSIS — F17200 Nicotine dependence, unspecified, uncomplicated: Secondary | ICD-10-CM | POA: Insufficient documentation

## 2013-12-05 DIAGNOSIS — M25569 Pain in unspecified knee: Secondary | ICD-10-CM | POA: Insufficient documentation

## 2013-12-05 MED ORDER — IBUPROFEN 800 MG PO TABS: 800.0000 mg | ORAL_TABLET | Freq: Three times a day (TID) | ORAL | Status: DC

## 2013-12-05 NOTE — ED Notes (Signed)
PA assistant  at bedside. 

## 2013-12-05 NOTE — Discharge Instructions (Signed)
Cryotherapy Cryotherapy means treatment with cold. Ice or gel packs can be used to reduce both pain and swelling. Ice is the most helpful within the first 24 to 48 hours after an injury or flareup from overusing a muscle or joint. Sprains, strains, spasms, burning pain, shooting pain, and aches can all be eased with ice. Ice can also be used when recovering from surgery. Ice is effective, has very few side effects, and is safe for most people to use. PRECAUTIONS  Ice is not a safe treatment option for people with:  Raynaud's phenomenon. This is a condition affecting small blood vessels in the extremities. Exposure to cold may cause your problems to return.  Cold hypersensitivity. There are many forms of cold hypersensitivity, including:  Cold urticaria. Red, itchy hives appear on the skin when the tissues begin to warm after being iced.  Cold erythema. This is a red, itchy rash caused by exposure to cold.  Cold hemoglobinuria. Red blood cells break down when the tissues begin to warm after being iced. The hemoglobin that carry oxygen are passed into the urine because they cannot combine with blood proteins fast enough.  Numbness or altered sensitivity in the area being iced. If you have any of the following conditions, do not use ice until you have discussed cryotherapy with your caregiver:  Heart conditions, such as arrhythmia, angina, or chronic heart disease.  High blood pressure.  Healing wounds or open skin in the area being iced.  Current infections.  Rheumatoid arthritis.  Poor circulation.  Diabetes. Ice slows the blood flow in the region it is applied. This is beneficial when trying to stop inflamed tissues from spreading irritating chemicals to surrounding tissues. However, if you expose your skin to cold temperatures for too long or without the proper protection, you can damage your skin or nerves. Watch for signs of skin damage due to cold. HOME CARE INSTRUCTIONS Follow  these tips to use ice and cold packs safely.  Place a dry or damp towel between the ice and skin. A damp towel will cool the skin more quickly, so you may need to shorten the time that the ice is used.  For a more rapid response, add gentle compression to the ice.  Ice for no more than 10 to 20 minutes at a time. The bonier the area you are icing, the less time it will take to get the benefits of ice.  Check your skin after 5 minutes to make sure there are no signs of a poor response to cold or skin damage.  Rest 20 minutes or more in between uses.  Once your skin is numb, you can end your treatment. You can test numbness by very lightly touching your skin. The touch should be so light that you do not see the skin dimple from the pressure of your fingertip. When using ice, most people will feel these normal sensations in this order: cold, burning, aching, and numbness.  Do not use ice on someone who cannot communicate their responses to pain, such as small children or people with dementia. HOW TO MAKE AN ICE PACK Ice packs are the most common way to use ice therapy. Other methods include ice massage, ice baths, and cryo-sprays. Muscle creams that cause a cold, tingly feeling do not offer the same benefits that ice offers and should not be used as a substitute unless recommended by your caregiver. To make an ice pack, do one of the following:  Place crushed ice or  a bag of frozen vegetables in a sealable plastic bag. Squeeze out the excess air. Place this bag inside another plastic bag. Slide the bag into a pillowcase or place a damp towel between your skin and the bag.  Mix 3 parts water with 1 part rubbing alcohol. Freeze the mixture in a sealable plastic bag. When you remove the mixture from the freezer, it will be slushy. Squeeze out the excess air. Place this bag inside another plastic bag. Slide the bag into a pillowcase or place a damp towel between your skin and the bag. SEEK MEDICAL  CARE IF:  You develop white spots on your skin. This may give the skin a blotchy (mottled) appearance.  Your skin turns blue or pale.  Your skin becomes waxy or hard.  Your swelling gets worse. MAKE SURE YOU:   Understand these instructions.  Will watch your condition.  Will get help right away if you are not doing well or get worse. Document Released: 02/01/2011 Document Revised: 08/30/2011 Document Reviewed: 02/01/2011 [facility]Exit[facility] Patient Information 2015 [place], [place]. This information is not intended to replace advice given to you by your health care provider. Make sure you discuss any questions you have with your health care provider. Bursitis Bursitis is a swelling and soreness (inflammation) of a fluid-filled sac (bursa) that overlies and protects a joint. It can be caused by injury, overuse of the joint, arthritis or infection. The joints most likely to be affected are the elbows, shoulders, hips and knees. HOME CARE INSTRUCTIONS   Apply ice to the affected area for 15-20 minutes each hour while awake for 2 days. Put the ice in a plastic bag and place a towel between the bag of ice and your skin.  Rest the injured joint as much as possible, but continue to put the joint through a full range of motion, 4 times per day. (The shoulder joint especially becomes rapidly "frozen" if not used.) When the pain lessens, begin normal slow movements and usual activities.  Only take over-the-counter or prescription medicines for pain, discomfort or fever as directed by your caregiver.  Your caregiver may recommend draining the bursa and injecting medicine into the bursa. This may help the healing process.  Follow all instructions for follow-up with your caregiver. This includes any orthopedic referrals, physical therapy and rehabilitation. Any delay in obtaining necessary care could result in a delay or failure of the bursitis to heal and chronic pain. SEEK IMMEDIATE MEDICAL CARE IF:   Your  pain increases even during treatment.  You develop an oral temperature above 102 F (38.9 C) and have heat and inflammation over the involved bursa. MAKE SURE YOU:   Understand these instructions.  Will watch your condition.  Will get help right away if you are not doing well or get worse. Document Released: 06/04/2000 Document Revised: 08/30/2011 Document Reviewed: 05/09/2009 [facility]Exit[facility] Patient Information 2015 [place], [place]. This information is not intended to replace advice given to you by your health care provider. Make sure you discuss any questions you have with your health care provider.

## 2013-12-05 NOTE — ED Notes (Signed)
Pt. reports right knee swelling " feels tight/pressure" , denies injury or fall , alert and oriented / no fever or chills.

## 2013-12-05 NOTE — ED Provider Notes (Signed)
CSN: [id number]     Arrival date & time 12/05/13  1951 History  This chart was scribed for non-physician practitioner Roxy Horsemanobert Kazi Reppond, PA-C working with Flint MelterElliott L Wentz, MD by Joaquin MusicKristina Sanchez-Matthews, ED Scribe. This patient was seen in room TR10C/TR10C and the patient's care was started at 8:50 PM .   Chief Complaint  Patient presents with  . Knee Pain   The history is provided by the patient. No language interpreter was used.   HPI Comments: [name] is a 28 y.o. female who presents to the Emergency Department complaining of increasing R knee pain with associated swelling that began 1 week ago. Pt states she has noticed her R knee clicking and popping when initially standing, walking, and other motions. States the knee pain feels "tight and pressure". Denies any recent injuries or falls.  Past Medical History  Diagnosis Date  . Injury of right rotator cuff   . Kidney infection    History reviewed. No pertinent past surgical history. Family History  Problem Relation Age of Onset  . Cancer Other    History  Substance Use Topics  . Smoking status: Current Every Day Smoker -- 0.25 packs/day    Types: Cigarettes  . Smokeless tobacco: Not on file  . Alcohol Use: Yes     Comment: social   OB History   Grav Para Term Preterm Abortions TAB SAB Ect Mult Living                 Review of Systems  Constitutional: Negative for fever and chills.  Respiratory: Negative for shortness of breath.   Cardiovascular: Negative for chest pain.  Gastrointestinal: Negative for nausea, vomiting, diarrhea and constipation.  Genitourinary: Negative for dysuria.  Musculoskeletal: Positive for arthralgias and myalgias. Negative for gait problem.       Knee pain  Skin: Negative for color change and wound.      Allergies  Review of patient's allergies indicates no known allergies.  Home Medications   Prior to Admission medications   Medication Sig Start Date End Date Taking?  Authorizing Provider  doxycycline (VIBRAMYCIN) 100 MG capsule Take 1 capsule (100 mg total) by mouth 2 (two) times daily. 07/03/13   Mora BellmanHannah S Merrell, PA-C  ibuprofen (ADVIL,MOTRIN) 200 MG tablet Take 400 mg by mouth once as needed (pain).    Historical Provider, MD  oxyCODONE-acetaminophen (PERCOCET/ROXICET) 5-325 MG per tablet Take 1-2 tablets by mouth every 4 (four) hours as needed for severe pain. 07/03/13   Mora BellmanHannah S Merrell, PA-C  promethazine (PHENERGAN) 25 MG tablet Take 1 tablet (25 mg total) by mouth every 6 (six) hours as needed for nausea or vomiting. 07/03/13   Mora BellmanHannah S Merrell, PA-C   BP 135/64  Pulse 66  Temp(Src) 99 F (37.2 C) (Oral)  Resp 16  SpO2 99%  LMP 11/07/2013  Physical Exam  Nursing note and vitals reviewed. Constitutional: She is oriented to person, place, and time. She appears well-developed and well-nourished. No distress.  HENT:  Head: Normocephalic and atraumatic.  Eyes: EOM are normal.  Neck: Neck supple. No tracheal deviation present.  Cardiovascular: Normal rate.   Pulmonary/Chest: Effort normal. No respiratory distress.  Musculoskeletal: Normal range of motion.  R knee mildly swollen. ROM and strength 5/5. Moderate tenderness to palpation over the patellar tendon. Joint stability testing intact.  Neurological: She is alert and oriented to person, place, and time.  Skin: Skin is warm and dry.  Psychiatric: She has a normal mood and affect. Her  behavior is normal.   ED Course  Procedures (including critical care time) DIAGNOSTIC STUDIES: Oxygen Saturation is 99% on RA, normal by my interpretation.    COORDINATION OF CARE: 8:52 PM-Discussed treatment plan which includes informed pt of radiology findings. Encouraged pt to apply ice to R knee and F/U with orthopedist if necessary. Will discharge pt with 800-mg Ibuprofen and knee sleeve. Pt agreed to plan.   Labs Review Labs Reviewed - No data to display  Imaging Review Dg Knee Complete 4 Views  Right  12/05/2013   CLINICAL DATA:  Right knee pain.  EXAM: RIGHT KNEE - COMPLETE 4+ VIEW  COMPARISON:  None.  FINDINGS: Anatomic alignment of the right knee. No fracture. No effusion. Joint space is preserved.  IMPRESSION: Negative.   Electronically Signed   By: Andreas NewportGeoffrey  Lamke M.D.   On: 12/05/2013 20:45     EKG Interpretation None      MDM   Final diagnoses:  Knee pain    Patient with knee pain and mild swelling.  Findings consistent with bursitis vs tendinitis.  Will treat with RICE and nsaids.  Recommend orthopedic follow-up.  I personally performed the services described in this documentation, which was scribed in my presence. The recorded information has been reviewed and is accurate.    Roxy Horsemanobert Kveon Casanas, PA-C 12/05/13 2057

## 2013-12-06 NOTE — ED Provider Notes (Signed)
Medical screening examination/treatment/procedure(s) were performed by non-physician practitioner and as supervising physician I was immediately available for consultation/collaboration.  Flint MelterElliott L Wentz, MD 12/06/13 [phone]

## 2014-01-07 ENCOUNTER — Encounter (HOSPITAL_COMMUNITY): Payer: Self-pay | Admitting: Emergency Medicine

## 2014-01-07 ENCOUNTER — Emergency Department (HOSPITAL_COMMUNITY)
Admission: EM | Admit: 2014-01-07 | Discharge: 2014-01-07 | Disposition: A | Discharge status: Home / Self Care | Payer: No Typology Code available for payment source | Attending: Emergency Medicine | Admitting: Emergency Medicine

## 2014-01-07 DIAGNOSIS — Z87828 Personal history of other (healed) physical injury and trauma: Secondary | ICD-10-CM | POA: Insufficient documentation

## 2014-01-07 DIAGNOSIS — F17200 Nicotine dependence, unspecified, uncomplicated: Secondary | ICD-10-CM | POA: Insufficient documentation

## 2014-01-07 DIAGNOSIS — M62830 Muscle spasm of back: Secondary | ICD-10-CM

## 2014-01-07 DIAGNOSIS — M545 Low back pain, unspecified: Secondary | ICD-10-CM | POA: Insufficient documentation

## 2014-01-07 DIAGNOSIS — Z8742 Personal history of other diseases of the female genital tract: Secondary | ICD-10-CM | POA: Insufficient documentation

## 2014-01-07 DIAGNOSIS — M5380 Other specified dorsopathies, site unspecified: Secondary | ICD-10-CM | POA: Insufficient documentation

## 2014-01-07 MED ORDER — OXYCODONE-ACETAMINOPHEN 5-325 MG PO TABS: 1.0000 | ORAL_TABLET | Freq: Four times a day (QID) | ORAL | Status: DC | PRN

## 2014-01-07 MED ORDER — NAPROXEN 500 MG PO TABS: 500.0000 mg | ORAL_TABLET | Freq: Two times a day (BID) | ORAL | Status: DC | PRN

## 2014-01-07 MED ORDER — METHOCARBAMOL 500 MG PO TABS: 500.0000 mg | ORAL_TABLET | Freq: Three times a day (TID) | ORAL | Status: DC | PRN

## 2014-01-07 NOTE — Discharge Instructions (Signed)
Back Pain:  Your back pain should be treated with medicines such as ibuprofen or aleve and this back pain should get better over the next 2 weeks. Return to your normal activities in 2 days, however do not do any heavy lifting over 10 pounds for the next 2 weeks. However if you develop severe or worsening pain, low back pain with fever, numbness, weakness or inability to walk or urinate, you should return to the ER immediately.  Please follow up with your doctor this week for a recheck if still having symptoms.  Low back pain is discomfort in the lower back that may be due to injuries to muscles and ligaments around the spine.  Occasionally, it may be caused by a a problem to a part of the spine called a disc.  The pain may last several days or a week;  However, most patients get completely well in 4 weeks.  Self - care:  The application of heat can help soothe the pain.  Maintaining your daily activities, including walking, is encourged, as it will help you get better faster than just staying in bed. Perform gentle stretching as discussed. Drink plenty of fluids.  Medications are also useful to help with pain control.  A commonly prescribed medications includes percocet. Do not drive or operate heavy machinery while taking this medication.  Non steroidal anti inflammatory medications including Ibuprofen and naproxen;  These medications help both pain and swelling and are very useful in treating back pain.  They should be taken with food, as they can cause stomach upset, and more seriously, stomach bleeding.    Muscle relaxants (Robaxin):  These medications can help with muscle tightness that is a cause of lower back pain.  Most of these medications can cause drowsiness, and it is not safe to drive or use dangerous machinery while taking them.  SEEK IMMEDIATE MEDICAL ATTENTION IF: New numbness, tingling, weakness, or problem with the use of your arms or legs.  Severe back pain not relieved with  medications.  Difficulty with or loss of control of your bowel or bladder control.  Increasing pain in any areas of the body (such as chest or abdominal pain).  Shortness of breath, dizziness or fainting.  Nausea (feeling sick to your stomach), vomiting, fever, or sweats.  You will need to follow up with orthopedic doctor in 1-2 weeks for reassessment.   Back Pain, Adult Low back pain is very common. About 1 in 5 people have back pain.The cause of low back pain is rarely dangerous. The pain often gets better over time.About half of people with a sudden onset of back pain feel better in just 2 weeks. About 8 in 10 people feel better by 6 weeks.  CAUSES Some common causes of back pain include:  Strain of the muscles or ligaments supporting the spine.  Wear and tear (degeneration) of the spinal discs.  Arthritis.  Direct injury to the back. DIAGNOSIS Most of the time, the direct cause of low back pain is not known.However, back pain can be treated effectively even when the exact cause of the pain is unknown.Answering your caregiver's questions about your overall health and symptoms is one of the most accurate ways to make sure the cause of your pain is not dangerous. If your caregiver needs more information, he or she may order lab work or imaging tests (X-rays or MRIs).However, even if imaging tests show changes in your back, this usually does not require surgery. HOME CARE INSTRUCTIONS For  many people, back pain returns.Since low back pain is rarely dangerous, it is often a condition that people can learn to [facility] Hospitalmanageon their own.   Remain active. It is stressful on the back to sit or stand in one place. Do not sit, drive, or stand in one place for more than 30 minutes at a time. Take short walks on level surfaces as soon as pain allows.Try to increase the length of time you walk each day.  Do not stay in bed.Resting more than 1 or 2 days can delay your recovery.  Do not avoid  exercise or work.Your body is made to move.It is not dangerous to be active, even though your back may hurt.Your back will likely heal faster if you return to being active before your pain is gone.  Pay attention to your body when you bend and lift. Many people have less discomfortwhen lifting if they bend their knees, keep the load close to their bodies,and avoid twisting. Often, the most comfortable positions are those that put less stress on your recovering back.  Find a comfortable position to sleep. Use a firm mattress and lie on your side with your knees slightly bent. If you lie on your back, put a pillow under your knees.  Only take over-the-counter or prescription medicines as directed by your caregiver. Over-the-counter medicines to reduce pain and inflammation are often the most helpful.Your caregiver may prescribe muscle relaxant drugs.These medicines help dull your pain so you can more quickly return to your normal activities and healthy exercise.  Put ice on the injured area.  Put ice in a plastic bag.  Place a towel between your skin and the bag.  Leave the ice on for 15-20 minutes, 03-04 times a day for the first 2 to 3 days. After that, ice and heat may be alternated to reduce pain and spasms.  Ask your caregiver about trying back exercises and gentle massage. This may be of some benefit.  Avoid feeling anxious or stressed.Stress increases muscle tension and can worsen back pain.It is important to recognize when you are anxious or stressed and learn ways to manage it.Exercise is a great option. SEEK MEDICAL CARE IF:  You have pain that is not relieved with rest or medicine.  You have pain that does not improve in 1 week.  You have new symptoms.  You are generally not feeling well. SEEK IMMEDIATE MEDICAL CARE IF:   You have pain that radiates from your back into your legs.  You develop new bowel or bladder control problems.  You have unusual weakness or  numbness in your arms or legs.  You develop nausea or vomiting.  You develop abdominal pain.  You feel faint. Document Released: 06/07/2005 Document Revised: 12/07/2011 Document Reviewed: 10/26/2010 [facility]Exit[facility] Patient Information 2015 [place], [place]. This information is not intended to replace advice given to you by your health care provider. Make sure you discuss any questions you have with your health care provider.  Muscle Cramps and Spasms Muscle cramps and spasms are when muscles tighten by themselves. They usually get better within minutes. Muscle cramps are painful. They are usually stronger and last longer than muscle spasms. Muscle spasms may or may not be painful. They can last a few seconds or much longer. HOME CARE  Drink enough fluid to keep your pee (urine) clear or pale yellow.  Massage, stretch, and relax the muscle.  Use a warm towel, heating pad, or warm shower water on tight muscles.  Place ice on  the muscle if it is tender or in pain.  Put ice in a plastic bag.  Place a towel between your skin and the bag.  Leave the ice on for 15-20 minutes, 03-04 times a day.  Only take medicine as told by your doctor. GET HELP RIGHT AWAY IF:  Your cramps or spasms get worse, happen more often, or do not get better with time. MAKE SURE YOU:  Understand these instructions.  Will watch your condition.  Will get help right away if you are not doing well or get worse. Document Released: 05/20/2008 Document Revised: 10/02/2012 Document Reviewed: 05/24/2012 [facility] Patient Information 2015 [place], [place]. This information is not intended to replace advice given to you by your health care provider. Make sure you discuss any questions you have with your health care provider.

## 2014-01-07 NOTE — ED Provider Notes (Signed)
CSN: [id number]     Arrival date & time 01/07/14  1559 History  This chart was scribed for non-physician practitioner, Allen Derry, PA-C working with Dagmar Hait, MD by Luisa Dago, ED scribe. This patient was seen in room TR11C/TR11C and the patient's care was started at 5:55 PM.     Chief Complaint  Patient presents with  . Back Pain   Patient is a 28 y.o. female presenting with back pain. The history is provided by the patient. No language interpreter was used.  Back Pain Location:  Lumbar spine Quality:  Stiffness Stiffness is present:  All day Radiates to: both legs. Pain severity:  Moderate Pain is:  Same all the time Onset quality:  Gradual Duration:  6 days Timing:  Constant Progression:  Unchanged Chronicity:  New Context: not falling, not MVA and not recent injury   Relieved by:  Nothing Worsened by:  Movement, bending, sitting and twisting Ineffective treatments:  Ibuprofen and muscle relaxants Associated symptoms: no abdominal pain, no bladder incontinence, no bowel incontinence, no chest pain, no dysuria, no fever, no leg pain, no numbness, no tingling and no weakness    HPI Comments: [name] is a 28 y.o. female with no significant PMHx who presents to the Emergency Department complaining of moderate, constant, worsening lower back pain that started approximately 5 days ago, worse with bending, sitting, moving and twisting, and unrelieved by ibuprofen and a friends' muscle relaxant medication. Pt states that the pain radiates down, on the sides of both legs but R>L. She states that she is unable to bend down or tie her shoes. [name] describes the pain as a "contraction" or tightening. Pt is a Advertising copywriter by trade. She denies any recent heavy lifting or twisting injuries. Reports taking Ibuprofen, Asprin, and muscle relaxer with no relief. Denies any paraesthesia, weakness, neck pain, chest pain, SOB, dysuria, hematuria, abdominal pain,  incontinence, groin tingling, or nausea/vomiting. Denies vaginal d/c or discomfort. LNMP: 1 day ago. Denies running as a hobby, no recent exercise.  Past Medical History  Diagnosis Date  . Injury of right rotator cuff   . Kidney infection    History reviewed. No pertinent past surgical history. Family History  Problem Relation Age of Onset  . Cancer Other    History  Substance Use Topics  . Smoking status: Current Every Day Smoker -- 0.25 packs/day    Types: Cigarettes  . Smokeless tobacco: Not on file  . Alcohol Use: Yes     Comment: social   OB History   Grav Para Term Preterm Abortions TAB SAB Ect Mult Living                 Review of Systems  Constitutional: Negative for fever.  Respiratory: Negative for shortness of breath.   Cardiovascular: Negative for chest pain and leg swelling.  Gastrointestinal: Negative for abdominal pain, constipation, abdominal distention and bowel incontinence.  Genitourinary: Negative for bladder incontinence, dysuria, urgency, frequency, flank pain and difficulty urinating.  Musculoskeletal: Positive for back pain. Negative for gait problem and joint swelling.  Skin: Negative for rash.  Neurological: Negative for tingling, weakness and numbness.      Allergies  Review of patient's allergies indicates no known allergies.  Home Medications   Prior to Admission medications   Medication Sig Start Date End Date Taking? Authorizing Provider  methocarbamol (ROBAXIN) 500 MG tablet Take 1 tablet (500 mg total) by mouth every 8 (eight) hours as needed for muscle spasms. 01/07/14  Shannon Balthazar Strupp Camprubi-Soms, PA-C  naproxen (NAPROSYN) 500 MG tablet Take 1 tablet (500 mg total) by mouth 2 (two) times daily as needed for mild pain, moderate pain or headache (TAKE WITH MEALS.). 01/07/14   Kinzlee Selvy Strupp Camprubi-Soms, PA-C  oxyCODONE-acetaminophen (PERCOCET) 5-325 MG per tablet Take 1-2 tablets by mouth every 6 (six) hours as needed for severe  pain. 01/07/14   Breon Rehm Strupp Camprubi-Soms, PA-C   BP 129/53  Pulse 76  Temp(Src) 98.6 F (37 C)  Resp 18  SpO2 100%  LMP 01/04/2014  Physical Exam  Nursing note and vitals reviewed. Constitutional: She is oriented to person, place, and time. Vital signs are normal. She appears well-developed and well-nourished. No distress.  HENT:  Head: Normocephalic and atraumatic.  Mouth/Throat: Mucous membranes are normal.  Eyes: Conjunctivae and EOM are normal.  Neck: Normal range of motion. Neck supple. No spinous process tenderness and no muscular tenderness present. No rigidity. Normal range of motion present.  Full range of motion and intact. No spinous process or paracervical muscle tenderness. No bony deformity or crepitus. No neck rigidity.  Cardiovascular: Normal rate, regular rhythm, normal heart sounds and intact distal pulses.   No murmur heard. Pulmonary/Chest: Effort normal and breath sounds normal. No respiratory distress. She has no decreased breath sounds. She has no wheezes. She has no rhonchi. She has no rales.  Abdominal: Soft. Normal appearance and bowel sounds are normal. She exhibits no distension. There is no tenderness. There is no rigidity, no rebound, no guarding and no CVA tenderness.  Soft, nontender, nondistended. Positive bowel sounds throughout. No CVA tenderness.  Musculoskeletal: Normal range of motion.       Right hip: Normal.       Left hip: Normal.       Lumbar back: She exhibits pain and spasm (left). She exhibits normal range of motion, no bony tenderness and no deformity.       Arms: Bilateral hips nontender to palpation with full range of motion intact. Lumbar spinous processes nontender to palpation, with no deformity or crepitus. Lumbar paraspinous muscles of the right side tender to palpation, with mild spasm noted. Negative straight leg raise bilaterally. Strength 5 out of 5 in all extremities. Sensation grossly intact.  Neurological: She is alert and  oriented to person, place, and time. She has normal strength and normal reflexes. No sensory deficit. Gait normal.  nonantalgic gait. Strength 5/5 in all extremities . Sensation grossly intact.DTRs intact  Skin: Skin is warm and dry.  Psychiatric: She has a normal mood and affect. Her behavior is normal.    ED Course  Procedures (including critical care time)  DIAGNOSTIC STUDIES: Oxygen Saturation is 100% on RA, normal by my interpretation.    COORDINATION OF CARE: 6:01 PM-Advised pt to return to the ED if she spikes a fever or is she begins to experience bowel or bladder incontinence. Also told pt to refrain from doing any heavy lifting. Will give pt a referral to orthopaedist specialist. Pt advised of plan for treatment and pt agrees.  Labs Review Labs Reviewed - No data to display  Imaging Review No results found.   EKG Interpretation None      MDM   Final diagnoses:  Left-sided low back pain without sciatica  Muscle spasm of back    [name] is a 28 y.o. female with no significant PMHx, presenting with lumbar pain x 5 days. She works as a Advertising copywriter but denies any injury. No red flag s/s of  low back pain. No s/s of central cord compression or cauda equina. Lower extremities are neurovascularly intact and patient is ambulating without difficulty. No need for imaging at this time. No lower abd complaints, or tenderness to palpation, no urinary or vaginal symptoms, therefore Will not obtain UA at this time. Mild spasm noted to the left lumbar paraspinous muscles, consistent with history of overuse or possible unknown twisting injury. Will give Rx for Percocet, naproxen, Robaxin. Follow up with orthopedics. Patient was counseled on back pain precautions and told to do activity as tolerated but do not lift, push, or pull heavy objects more than 10 pounds for the next week. Patient counseled to use ice or heat on back for no longer than 15 minutes every hour. The patient  verbalizes understanding and agrees with the plan. Stable for discharge.  I personally performed the services described in this documentation, which was scribed in my presence. The recorded information has been reviewed and is accurate.  BP 110/64  Pulse 72  Temp(Src) 98 F (36.7 C) (Oral)  Resp 16  SpO2 100%  LMP 01/04/2014    Donnita FallsMercedes Strupp Camprubi-Soms, PA-C 01/08/14 [phone]

## 2014-01-07 NOTE — ED Notes (Signed)
Per pt sts she has been having lower back pain for a while. sts radiates into her hip. Hx of same since epidural.

## 2014-01-08 NOTE — ED Provider Notes (Signed)
Medical screening examination/treatment/procedure(s) were performed by non-physician practitioner and as supervising physician I was immediately available for consultation/collaboration.   EKG Interpretation None        William Lanina Larranaga, MD 01/08/14 2248 

## 2014-03-18 ENCOUNTER — Encounter (HOSPITAL_COMMUNITY): Payer: Self-pay | Admitting: Emergency Medicine

## 2014-03-18 ENCOUNTER — Emergency Department (HOSPITAL_COMMUNITY)
Admission: EM | Admit: 2014-03-18 | Discharge: 2014-03-18 | Disposition: A | Discharge status: Home / Self Care | Payer: No Typology Code available for payment source | Attending: Emergency Medicine | Admitting: Emergency Medicine

## 2014-03-18 DIAGNOSIS — F17200 Nicotine dependence, unspecified, uncomplicated: Secondary | ICD-10-CM | POA: Insufficient documentation

## 2014-03-18 DIAGNOSIS — Y9389 Activity, other specified: Secondary | ICD-10-CM | POA: Insufficient documentation

## 2014-03-18 DIAGNOSIS — S01501A Unspecified open wound of lip, initial encounter: Secondary | ICD-10-CM | POA: Insufficient documentation

## 2014-03-18 DIAGNOSIS — Y9289 Other specified places as the place of occurrence of the external cause: Secondary | ICD-10-CM | POA: Insufficient documentation

## 2014-03-18 DIAGNOSIS — Z87440 Personal history of urinary (tract) infections: Secondary | ICD-10-CM | POA: Insufficient documentation

## 2014-03-18 DIAGNOSIS — W268XXA Contact with other sharp object(s), not elsewhere classified, initial encounter: Secondary | ICD-10-CM | POA: Insufficient documentation

## 2014-03-18 DIAGNOSIS — S00551A Superficial foreign body of lip, initial encounter: Secondary | ICD-10-CM

## 2014-03-18 NOTE — ED Notes (Signed)
Per pt, states earring post stuck in right upper lip

## 2014-03-18 NOTE — ED Provider Notes (Signed)
CSN: [id number]     Arrival date & time 03/18/14  0940 History   First MD Initiated Contact with Patient 03/18/14 1011     Chief Complaint  Patient presents with  . foreign object in lip      (Consider location/radiation/quality/duration/timing/severity/associated sxs/prior Treatment) HPI Comments: [name] is a 28 y.o. female who presents to the emergency department today complaining of a metallic foreign body in her with piercing. She states that last week she was eating and piercing became snagged on her tooth, pulling the post portion through her lip, which left the ball portion embedded into her leg. She states she has not had any pain, swelling, drainage, or redness to this area. She states she came today because she wanted to have it removed, although it is not bothering her. She did not come last week because she was concerned about having a face cut open to remove it. She denies any fevers/chills, or any other complaints.  Patient is a 28 y.o. female presenting with foreign body. The history is provided by the patient. No language interpreter was used.  Foreign Body Location:  Skin Suspected object:  Metal Quality: no pain. Pain severity:  No pain Duration:  6 days Progression:  Unchanged Chronicity:  New Worsened by:  Nothing tried Ineffective treatments:  None tried   Past Medical History  Diagnosis Date  . Injury of right rotator cuff   . Kidney infection    History reviewed. No pertinent past surgical history. Family History  Problem Relation Age of Onset  . Cancer Other    History  Substance Use Topics  . Smoking status: Current Every Day Smoker -- 0.25 packs/day    Types: Cigarettes  . Smokeless tobacco: Not on file  . Alcohol Use: Yes     Comment: social   OB History   Grav Para Term Preterm Abortions TAB SAB Ect Mult Living                 Review of Systems  Constitutional: Negative for fever and chills.  HENT: Negative for facial swelling.    Skin: Negative for color change.  Hematological: Does not bruise/bleed easily.   10 Systems reviewed and are negative for acute change except as noted in the HPI.     Allergies  Review of patient's allergies indicates no known allergies.  Home Medications   Prior to Admission medications   Medication Sig Start Date End Date Taking? Authorizing Provider  methocarbamol (ROBAXIN) 500 MG tablet Take 1 tablet (500 mg total) by mouth every 8 (eight) hours as needed for muscle spasms. 01/07/14   Alver Leete Strupp Camprubi-Soms, PA-C  naproxen (NAPROSYN) 500 MG tablet Take 1 tablet (500 mg total) by mouth 2 (two) times daily as needed for mild pain, moderate pain or headache (TAKE WITH MEALS.). 01/07/14   Cassidy Tashiro Strupp Camprubi-Soms, PA-C  oxyCODONE-acetaminophen (PERCOCET) 5-325 MG per tablet Take 1-2 tablets by mouth every 6 (six) hours as needed for severe pain. 01/07/14   Birdie Beveridge Strupp Camprubi-Soms, PA-C   BP 121/70  Pulse 70  Temp(Src) 98.6 F (37 C) (Oral)  Resp 16  SpO2 100%  LMP 03/18/2014 Physical Exam  Nursing note and vitals reviewed. Constitutional: She is oriented to person, place, and time. Vital signs are normal. She appears well-developed and well-nourished.  Non-toxic appearance. No distress.  Afebrile, nontoxic  HENT:  Head: Normocephalic and atraumatic.  Mouth/Throat: Oropharynx is clear and moist and mucous membranes are normal.  No lip swelling,  small spherical objected palpated medial to a closed lip piercing over R upper lip, no erythema or warmth, no discharge, nonTTP  Eyes: Conjunctivae and EOM are normal. Right eye exhibits no discharge. Left eye exhibits no discharge.  Neck: Normal range of motion. Neck supple.  Cardiovascular: Normal rate and intact distal pulses.   Pulmonary/Chest: Effort normal. No respiratory distress.  Abdominal: Normal appearance. She exhibits no distension.  Musculoskeletal: Normal range of motion.  Neurological: She is alert and  oriented to person, place, and time.  Skin: Skin is warm, dry and intact. No rash noted. No erythema.  No erythema or warmth over upper lip  Psychiatric: She has a normal mood and affect.    ED Course  Procedures (including critical care time) Labs Review Labs Reviewed - No data to display  Imaging Review No results found.   EKG Interpretation None      MDM   Final diagnoses:  Foreign body in lip, initial encounter    28y/o female with retained FB in upper lip. Will refer to plastic surgeon for removal as this is not causing any concerning medical issues at this time requiring removal emergently. Discussed that risks outweight benefits of removing FB in face, given poor cosmetic results when not done by plastic surgeon. Return precautions given for signs of infection. I explained the diagnosis and have given explicit precautions to return to the ER including for any other new or worsening symptoms. The patient understands and accepts the medical plan as it's been dictated and I have answered their questions. Discharge instructions concerning home care and prescriptions have been given. The patient is STABLE and is discharged to home in good condition.      Donnita Falls [place], [place] 03/18/14 1038

## 2014-03-18 NOTE — Discharge Instructions (Signed)
The foreign body in your lip will need to be removed by a plastic surgeon, if you choose to remove it. If it appears to develop signs of infection, such as redness, warmth/fever, drainage, or pain, return to the ER for evaluation and possible emergent removal.

## 2014-03-18 NOTE — ED Provider Notes (Signed)
Medical screening examination/treatment/procedure(s) were performed by non-physician practitioner and as supervising physician I was immediately available for consultation/collaboration.   EKG Interpretation None        Rolan Bucco, MD 03/18/14 1610

## 2014-04-12 ENCOUNTER — Ambulatory Visit: Payer: No Typology Code available for payment source

## 2014-04-27 ENCOUNTER — Emergency Department (HOSPITAL_COMMUNITY)
Admission: EM | Admit: 2014-04-27 | Discharge: 2014-04-27 | Disposition: A | Discharge status: Home / Self Care | Payer: No Typology Code available for payment source | Attending: Emergency Medicine | Admitting: Emergency Medicine

## 2014-04-27 ENCOUNTER — Encounter (HOSPITAL_COMMUNITY): Payer: Self-pay | Admitting: *Deleted

## 2014-04-27 DIAGNOSIS — J029 Acute pharyngitis, unspecified: Secondary | ICD-10-CM | POA: Insufficient documentation

## 2014-04-27 DIAGNOSIS — Z87448 Personal history of other diseases of urinary system: Secondary | ICD-10-CM | POA: Insufficient documentation

## 2014-04-27 DIAGNOSIS — H9201 Otalgia, right ear: Secondary | ICD-10-CM | POA: Insufficient documentation

## 2014-04-27 DIAGNOSIS — Z87828 Personal history of other (healed) physical injury and trauma: Secondary | ICD-10-CM | POA: Insufficient documentation

## 2014-04-27 DIAGNOSIS — H6091 Unspecified otitis externa, right ear: Secondary | ICD-10-CM | POA: Insufficient documentation

## 2014-04-27 DIAGNOSIS — Z720 Tobacco use: Secondary | ICD-10-CM | POA: Insufficient documentation

## 2014-04-27 DIAGNOSIS — H6121 Impacted cerumen, right ear: Secondary | ICD-10-CM | POA: Insufficient documentation

## 2014-04-27 MED ORDER — HYDROCODONE-ACETAMINOPHEN 5-325 MG PO TABS: 1.0000 | ORAL_TABLET | ORAL | Status: DC | PRN

## 2014-04-27 MED ORDER — IBUPROFEN 600 MG PO TABS: 600.0000 mg | ORAL_TABLET | Freq: Four times a day (QID) | ORAL | Status: DC | PRN

## 2014-04-27 MED ORDER — CIPROFLOXACIN-DEXAMETHASONE 0.3-0.1 % OT SUSP: 4.0000 [drp] | Freq: Two times a day (BID) | OTIC | Status: DC

## 2014-04-27 MED ORDER — AMOXICILLIN 500 MG PO CAPS: 1000.0000 mg | ORAL_CAPSULE | Freq: Three times a day (TID) | ORAL | Status: DC

## 2014-04-27 NOTE — ED Provider Notes (Signed)
CSN: [id number]     Arrival date & time 04/27/14  1312 History  This chart was scribed for Denise DrownLauren Noble Cicalese, PA-C, working with Layla MawKristen N Ward, DO by Chestine SporeSoijett Blue, ED Scribe. The patient was seen in room TR05C/TR05C at 2:00 PM.    Chief Complaint  Patient presents with  . Otalgia     HPI Comments: Denise GreenhouseDonieta L Ponce is a 28 y.o. female who presents to the Emergency Department complaining of right ear pain onset 1 week. She reports that it began as her not being able to hear out of the ear and then it got worse. She states that her ear pain has not resolved, but been a constant pain, denies any resolution of symptoms. She states that the last time that this happened she had her ear irrigated to aid in viewing it. She states that she is having associated symptoms of ear drainage for 1 week, cough, congestion, and sore throat. She denies fever, chills, and left ear pain, and any other associated symptoms.  The history is provided by the patient. No language interpreter was used.    Past Medical History  Diagnosis Date  . Injury of right rotator cuff   . Kidney infection    History reviewed. No pertinent past surgical history. Family History  Problem Relation Age of Onset  . Cancer Other    History  Substance Use Topics  . Smoking status: Current Every Day Smoker -- 0.25 packs/day    Types: Cigarettes  . Smokeless tobacco: Not on file  . Alcohol Use: Yes     Comment: social   OB History    No data available     Review of Systems  Constitutional: Negative for fever and chills.  HENT: Positive for congestion, ear discharge, ear pain and sore throat.   Respiratory: Positive for cough.       Allergies  Review of patient's allergies indicates no known allergies.  Home Medications   Prior to Admission medications   Medication Sig Start Date End Date Taking? Authorizing Provider  methocarbamol (ROBAXIN) 500 MG tablet Take 1 tablet (500 mg total) by mouth every 8 (eight) hours as  needed for muscle spasms. 01/07/14   Mercedes Strupp Camprubi-Soms, PA-C  naproxen (NAPROSYN) 500 MG tablet Take 1 tablet (500 mg total) by mouth 2 (two) times daily as needed for mild pain, moderate pain or headache (TAKE WITH MEALS.). 01/07/14   Mercedes Strupp Camprubi-Soms, PA-C  oxyCODONE-acetaminophen (PERCOCET) 5-325 MG per tablet Take 1-2 tablets by mouth every 6 (six) hours as needed for severe pain. 01/07/14   Mercedes Strupp Camprubi-Soms, PA-C   BP 118/62 mmHg  Pulse 65  Temp(Src) 98.6 F (37 C)  Resp 16  Ht 5\' 4"  (1.626 m)  Wt 145 lb (65.772 kg)  BMI 24.88 kg/m2  SpO2 100%  LMP 04/21/2014 Physical Exam  Constitutional: She is oriented to person, place, and time. She appears well-developed and well-nourished. No distress.  HENT:  Head: Normocephalic and atraumatic.  Right Ear: There is drainage.  Left Ear: Tympanic membrane and ear canal normal. Tympanic membrane is not retracted.  No middle ear effusion.  No tragal tenderness. Right TM completely obscured due to cerumen impaction.   Eyes: EOM are normal.  Neck: Neck supple. No tracheal deviation present.  Cardiovascular: Normal rate.   Pulmonary/Chest: Effort normal. No respiratory distress.  Musculoskeletal: Normal range of motion.  Lymphadenopathy:       Head (right side): No preauricular and no posterior auricular adenopathy present.  Neurological: She is alert and oriented to person, place, and time.  Skin: Skin is warm and dry.  Psychiatric: She has a normal mood and affect. Her behavior is normal.  Nursing note and vitals reviewed.   ED Course  Procedures (including critical care time) Labs Review Labs Reviewed - No data to display  Imaging Review No results found.   EKG Interpretation None      MDM   Final diagnoses:  Otitis externa, right  Right ear pain  Cerumen impaction, right   Patient presents with right ear pain and drainage, exam concerning for otitis externa, unable to remove all cerumen  safely. Plan to treat for possible otitis media, ENT follow-up given, pain medication given. Meds given in ED:  Medications - No data to display  New Prescriptions   AMOXICILLIN (AMOXIL) 500 MG CAPSULE    Take 2 capsules (1,000 mg total) by mouth 3 (three) times daily.   CIPROFLOXACIN-DEXAMETHASONE (CIPRODEX) OTIC SUSPENSION    Place 4 drops into the right ear 2 (two) times daily.   HYDROCODONE-ACETAMINOPHEN (NORCO/VICODIN) 5-325 MG PER TABLET    Take 1 tablet by mouth every 4 (four) hours as needed for moderate pain or severe pain.   IBUPROFEN (ADVIL,MOTRIN) 600 MG TABLET    Take 1 tablet (600 mg total) by mouth every 6 (six) hours as needed.   I personally performed the services described in this documentation, which was scribed in my presence. The recorded information has been reviewed and is accurate.    Denise DrownLauren Sevon Rotert, PA-C 04/27/14 1428  Kristen N Ward, DO 04/27/14 1528

## 2014-04-27 NOTE — ED Notes (Signed)
Pt reports right ear pain x 1 week

## 2014-04-27 NOTE — ED Notes (Signed)
Declined W/C at D/C and was escorted to lobby by RN. 

## 2014-04-27 NOTE — Discharge Instructions (Signed)
Call an ENT specialist for further evaluation of your ear pain and drainage. Call for a follow up appointment with a Family or Primary Care Provider.  Return if Symptoms worsen.   Take medication as prescribed.  Do not operate heavy machinery or drink alcohol while taking narcotic pain medication.

## 2014-05-01 ENCOUNTER — Ambulatory Visit: Payer: No Typology Code available for payment source

## 2014-05-09 IMAGING — US US PELVIS COMPLETE
1 series · 14 of 25 positions shown · non-contrast
Comparison: 07/03/2013

CLINICAL DATA: Pelvic pain, follow-up ovarian cyst



[Series 1: us pelvis complete · 0.24mm/px · 14 of 73 slices shown]
[im 1/73]
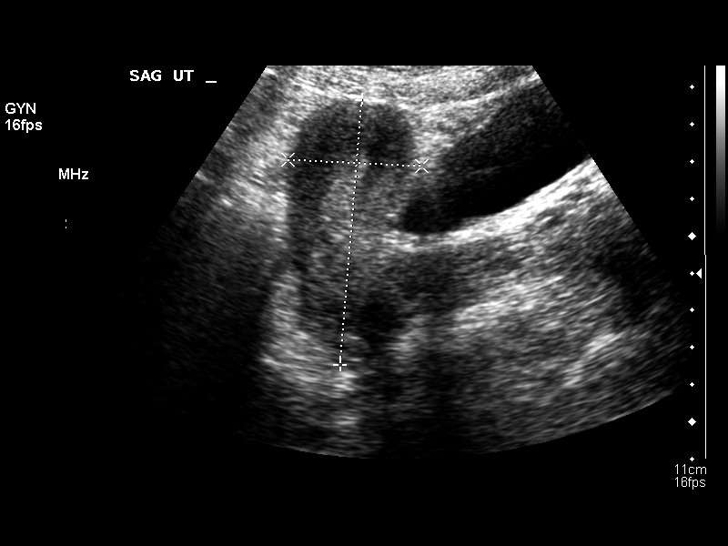
[im 7/73]
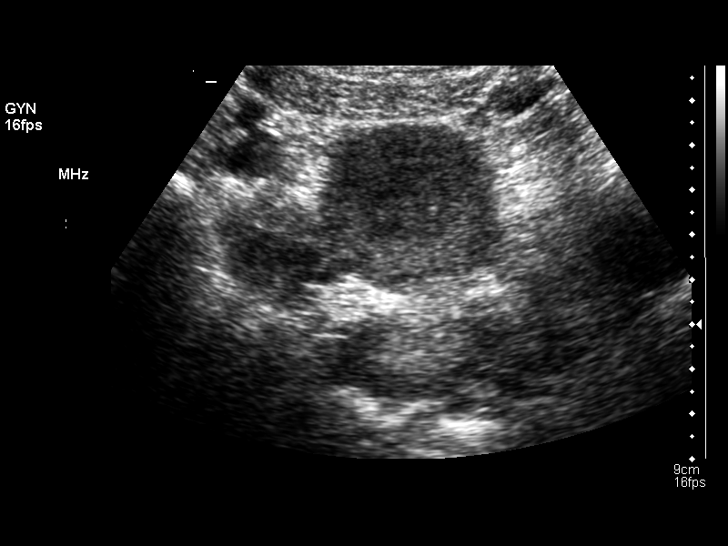
[im 13/73]
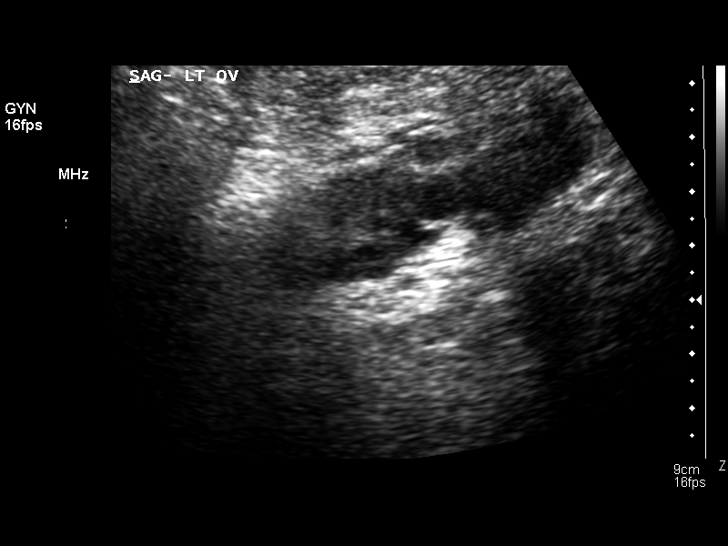
[im 19/73]
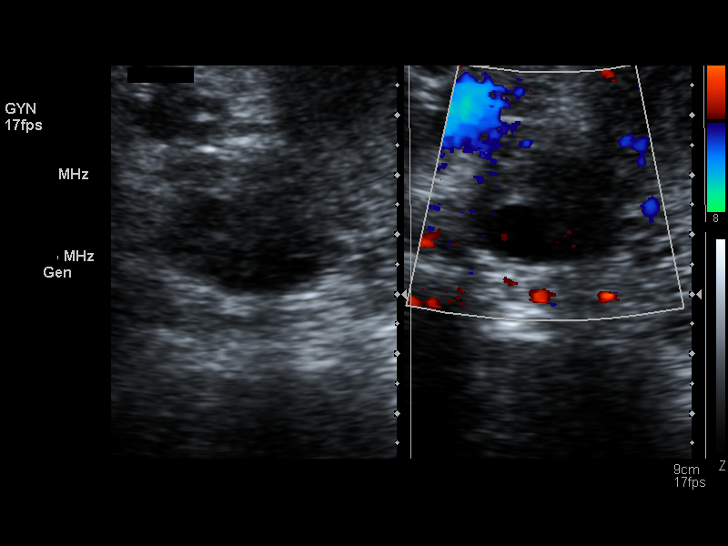
[im 25/73]
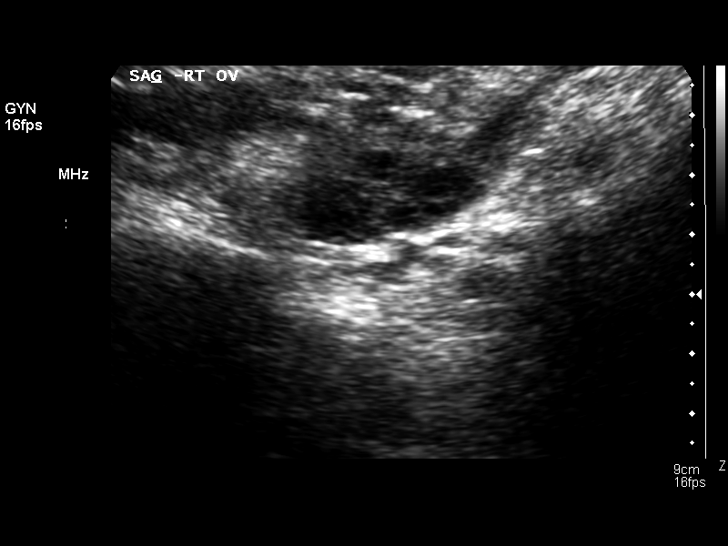
[im 28/73]
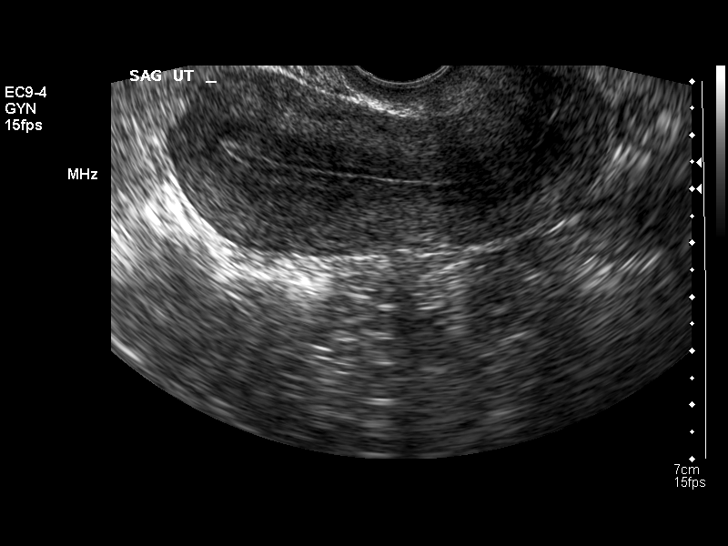
[im 34/73]
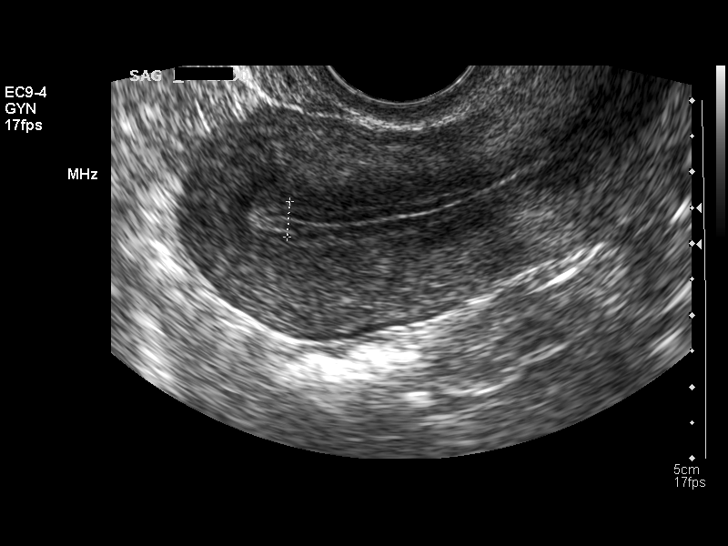
[im 40/73]
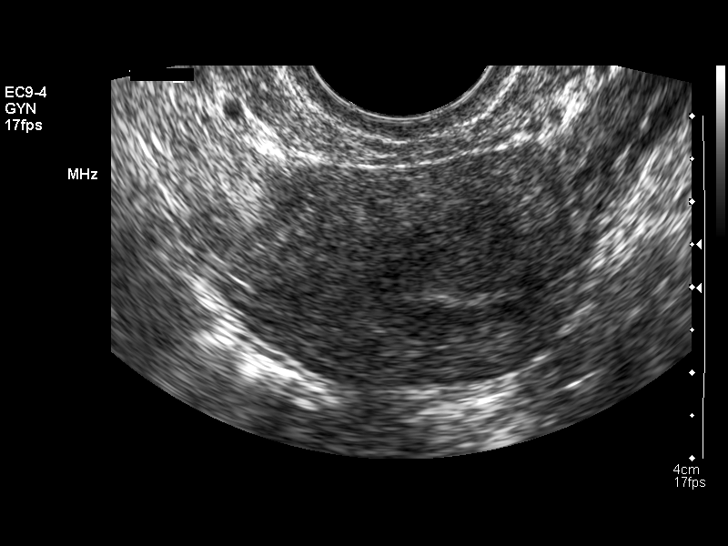
[im 46/73]
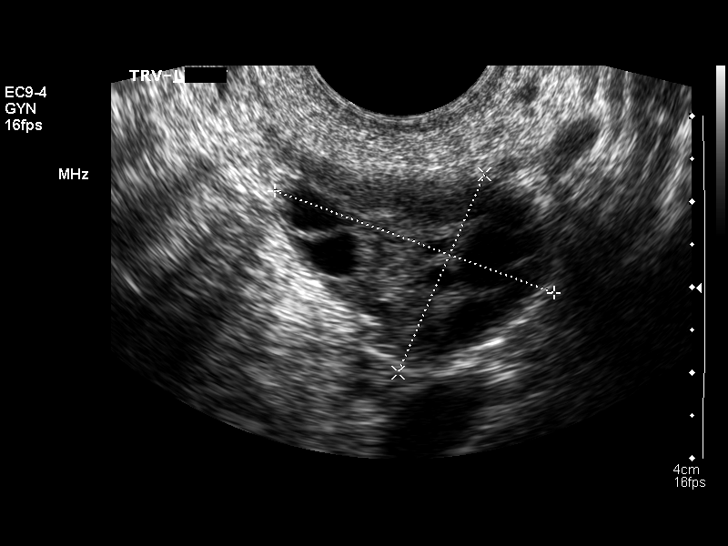
[im 49/73]
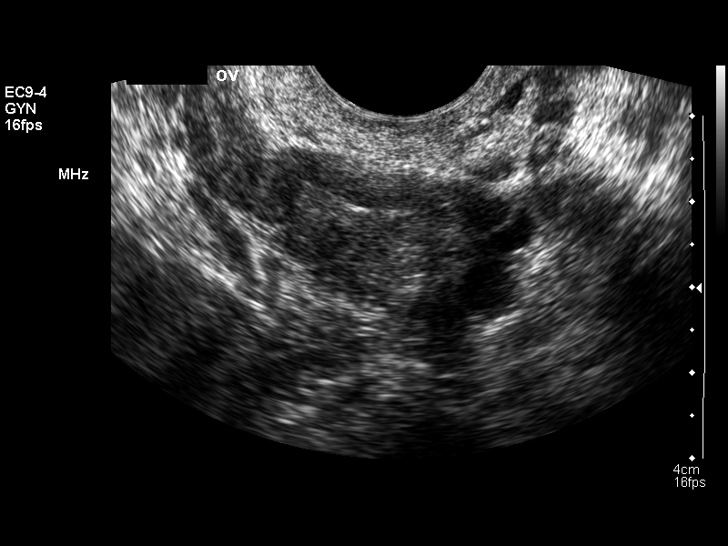
[im 55/73]
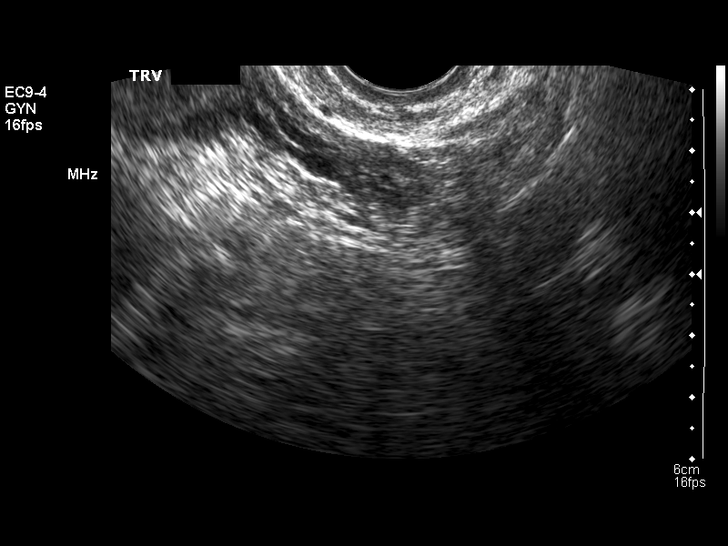
[im 61/73]
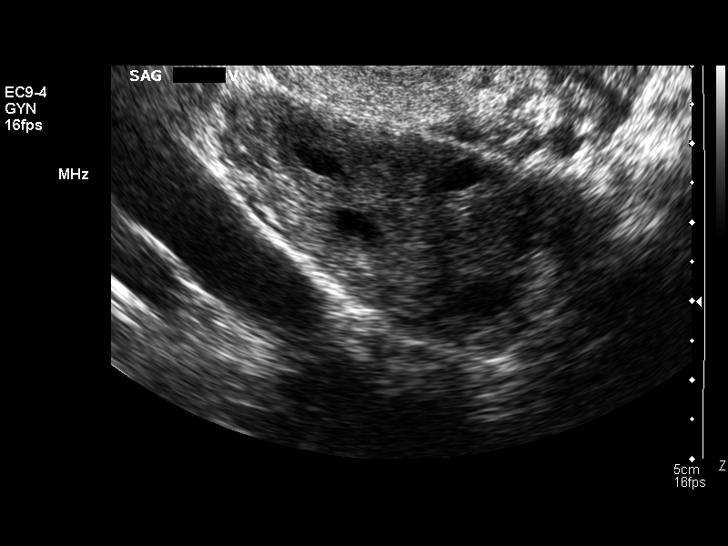
[im 67/73]
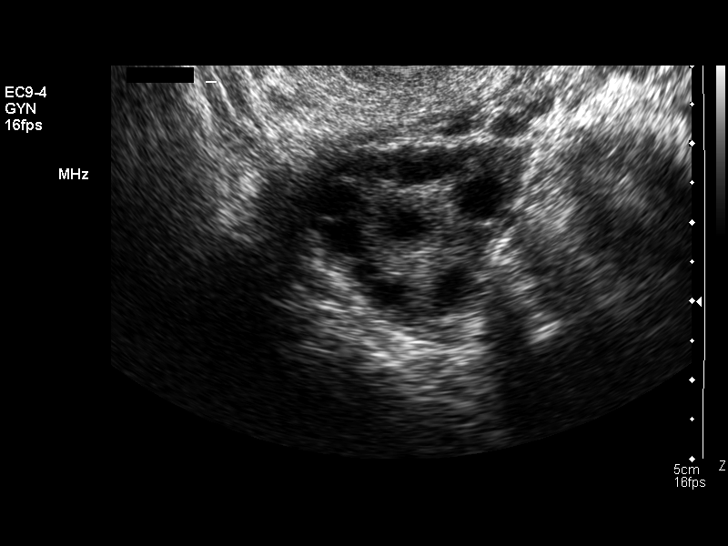
[im 73/73]
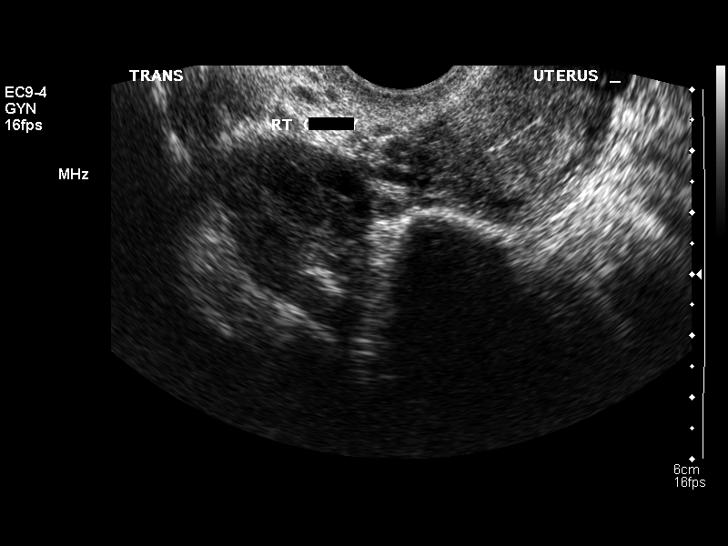

[14 of 25 positions shown; findings below may reference images not displayed]

FINDINGS: Uterus

Measurements: 8.3 x 3.2 x 4.3 cm. No fibroids or other mass
visualized.

Endometrium

Thickness: 5 mm.  No focal abnormality visualized.

Right ovary

Measurements: 5.0 x 2.2 x 2.9 cm. Normal appearance/no adnexal mass.

Left ovary

Measurements: 3.5 x 2.5 x 3.4 cm. Normal appearance/no adnexal mass.

Other findings

No free fluid.
IMPRESSION: Negative pelvic ultrasound.

Prior dominant right ovarian cyst has resolved.

## 2014-05-30 ENCOUNTER — Emergency Department (HOSPITAL_COMMUNITY)
Admission: EM | Admit: 2014-05-30 | Discharge: 2014-05-31 | Disposition: A | Discharge status: Home / Self Care | Payer: No Typology Code available for payment source | Attending: Emergency Medicine | Admitting: Emergency Medicine

## 2014-05-30 ENCOUNTER — Encounter (HOSPITAL_COMMUNITY): Payer: Self-pay | Admitting: Emergency Medicine

## 2014-05-30 DIAGNOSIS — Z79899 Other long term (current) drug therapy: Secondary | ICD-10-CM | POA: Insufficient documentation

## 2014-05-30 DIAGNOSIS — Z87828 Personal history of other (healed) physical injury and trauma: Secondary | ICD-10-CM | POA: Insufficient documentation

## 2014-05-30 DIAGNOSIS — K088 Other specified disorders of teeth and supporting structures: Secondary | ICD-10-CM | POA: Insufficient documentation

## 2014-05-30 DIAGNOSIS — K0889 Other specified disorders of teeth and supporting structures: Secondary | ICD-10-CM

## 2014-05-30 DIAGNOSIS — Z792 Long term (current) use of antibiotics: Secondary | ICD-10-CM | POA: Insufficient documentation

## 2014-05-30 DIAGNOSIS — Z87448 Personal history of other diseases of urinary system: Secondary | ICD-10-CM | POA: Insufficient documentation

## 2014-05-30 DIAGNOSIS — Z720 Tobacco use: Secondary | ICD-10-CM | POA: Insufficient documentation

## 2014-05-30 MED ORDER — BUPIVACAINE HCL (PF) 0.5 % IJ SOLN
50.0000 mL | Freq: Once | INTRAMUSCULAR | Status: AC
  Administered 2014-05-30: 50 mL
  Filled 2014-05-30: qty 60

## 2014-05-30 NOTE — ED Provider Notes (Signed)
CSN: [id number]     Arrival date & time 05/30/14  2033 History  This chart was scribed for non-physician practitioner, Denise PeekBenjamin Yareli Carthen, PA-C, working with Purvis SheffieldForrest Harrison, MD, by Bronson CurbJacqueline Melvin, ED Scribe. This patient was seen in room TR09C/TR09C and the patient's care was started at 10:31 PM.    Chief Complaint  Patient presents with  . Dental Pain    The history is provided by the patient. No language interpreter was used.     HPI Comments: [name] is a 28 y.o. female who presents to the Emergency Department complaining of left lower dental pain for the past 3 days. Patient states the pain radiates to her left ear and down toward the throat.  She has taken ibuprofen and hydrocodone without relief. She denies fevers, left cheek pain, or difficulty breathing, eating or drinking. Patient is not established with a dentist.    Past Medical History  Diagnosis Date  . Injury of right rotator cuff   . Kidney infection    History reviewed. No pertinent past surgical history. Family History  Problem Relation Age of Onset  . Cancer Other    History  Substance Use Topics  . Smoking status: Current Every Day Smoker -- 0.25 packs/day    Types: Cigarettes  . Smokeless tobacco: Not on file  . Alcohol Use: Yes     Comment: social   OB History    No data available     Review of Systems  Constitutional: Negative for fever.  HENT: Positive for dental problem and ear pain. Negative for trouble swallowing.   Neurological: Positive for headaches.  All other systems reviewed and are negative.     Allergies  Review of patient's allergies indicates no known allergies.  Home Medications   Prior to Admission medications   Medication Sig Start Date End Date Taking? Authorizing Provider  amoxicillin (AMOXIL) 500 MG capsule Take 2 capsules (1,000 mg total) by mouth 3 (three) times daily. 04/27/14   Mellody DrownLauren Parker, PA-C  ciprofloxacin-dexamethasone (CIPRODEX) otic suspension Place  4 drops into the right ear 2 (two) times daily. 04/27/14   Mellody DrownLauren Parker, PA-C  HYDROcodone-acetaminophen (NORCO/VICODIN) 5-325 MG per tablet Take 2 tablets by mouth every 4 (four) hours as needed for moderate pain or severe pain. 05/31/14   Earle GellBenjamin W Mort Smelser, PA-C  ibuprofen (ADVIL,MOTRIN) 600 MG tablet Take 1 tablet (600 mg total) by mouth every 6 (six) hours as needed. 04/27/14   Mellody DrownLauren Parker, PA-C  methocarbamol (ROBAXIN) 500 MG tablet Take 1 tablet (500 mg total) by mouth every 8 (eight) hours as needed for muscle spasms. 01/07/14   Mercedes Strupp Camprubi-Soms, PA-C  naproxen (NAPROSYN) 500 MG tablet Take 1 tablet (500 mg total) by mouth 2 (two) times daily as needed for mild pain, moderate pain or headache (TAKE WITH MEALS.). 01/07/14   Mercedes Strupp Camprubi-Soms, PA-C  oxyCODONE-acetaminophen (PERCOCET) 5-325 MG per tablet Take 1-2 tablets by mouth every 6 (six) hours as needed for severe pain. 01/07/14   Mercedes Strupp Camprubi-Soms, PA-C  penicillin v potassium (VEETID) 500 MG tablet Take 1 tablet (500 mg total) by mouth 4 (four) times daily. 05/31/14 06/07/14  Sharlene MottsBenjamin W Marialy Urbanczyk, PA-C   Triage Vitals: BP 140/68 mmHg  Pulse 62  Temp(Src) 98.4 F (36.9 C) (Oral)  Resp 16  SpO2 100%  Physical Exam  Constitutional: She is oriented to person, place, and time. She appears well-developed and well-nourished. No distress.  HENT:  Head: Normocephalic and atraumatic.  Mouth/Throat:  Lower left molar with significant ulceration into the dentin. Will require dentistry. No evidence of drainable mass. No evidence of Ludwig angina or Vincent angina.  Eyes: Conjunctivae and EOM are normal.  Neck: Neck supple. No tracheal deviation present.  Cardiovascular: Normal rate.   Pulmonary/Chest: Effort normal. No respiratory distress.  Musculoskeletal: Normal range of motion.  Neurological: She is alert and oriented to person, place, and time.  Skin: Skin is warm and dry.  Psychiatric: She has a  normal mood and affect. Her behavior is normal.  Nursing note and vitals reviewed.   ED Course  NERVE BLOCK Date/Time: 05/31/2014 12:08 AM Performed by: Sharlene MottsARTNER, Oluwatobi Visser W Authorized by: Sharlene MottsARTNER, Mathieu Schloemer W Consent: Verbal consent obtained. Risks and benefits: risks, benefits and alternatives were discussed Consent given by: patient Patient understanding: patient states understanding of the procedure being performed Site marked: the operative site was marked Patient identity confirmed: verbally with patient Time out: Immediately prior to procedure a "time out" was called to verify the correct patient, procedure, equipment, support staff and site/side marked as required. Indications: pain relief Body area: face/mouth Nerve: inferior alveolar Laterality: left Patient sedated: no Patient position: sitting Needle gauge: 27 G Location technique: anatomical landmarks Local anesthetic: bupivacaine 0.5% without epinephrine Anesthetic total: 3 ml Outcome: pain improved Patient tolerance: Patient tolerated the procedure well with no immediate complications   (including critical care time)  DIAGNOSTIC STUDIES: Oxygen Saturation is 100% on room air, normal by my interpretation.    COORDINATION OF CARE: At 2235 Discussed treatment plan with patient which includes dental block. Patient agrees.   Labs Review Labs Reviewed - No data to display  Imaging Review No results found.   EKG Interpretation None     Meds given in ED:  Medications  bupivacaine (MARCAINE) 0.5 % injection 50 mL (50 mLs Infiltration Given 05/30/14 2302)    Discharge Medication List as of 05/31/2014 12:07 AM    START taking these medications   Details  penicillin v potassium (VEETID) 500 MG tablet Take 1 tablet (500 mg total) by mouth 4 (four) times daily., Starting 05/31/2014, Until Fri 06/07/14, Print       Filed Vitals:   05/30/14 2051 05/31/14 0012  BP: 140/68 123/73  Pulse: 62 67  Temp: 98.4  F (36.9 C) 98.1 F (36.7 C)  TempSrc: Oral Oral  Resp: 16 20  SpO2: 100% 100%    MDM  Vitals stable - WNL -afebrile Pt resting comfortably in ED. pain improved with nerve block completed in ED PE-not concerning further acute or emergent pathology. Patient has no unilateral tonsillar swelling, submandibular tenderness, glossal swelling. Uvula is midline, tolerating secretions well, patent airway. Low concern for PTA, Ludwig or vincents angina Will DC with antibiotics, pain medicine and follow-up referral with Dr. Mayford Knifeurner, DDS Discussed f/u with PCP and return precautions, pt very amenable to plan. Patient stable, in good condition and is appropriate for discharge   Final diagnoses:  Pain, dental    I personally performed the services described in this documentation, which was scribed in my presence. The recorded information has been reviewed and is accurate.    Earle GellBenjamin W Franklintownartner, PA-C 05/31/14 2039  Purvis SheffieldForrest Harrison, MD 05/31/14 2046

## 2014-05-30 NOTE — ED Notes (Signed)
C/o L lower toothache x 2-3 days.

## 2014-05-31 MED ORDER — PENICILLIN V POTASSIUM 500 MG PO TABS: 500.0000 mg | ORAL_TABLET | Freq: Four times a day (QID) | ORAL | Status: AC

## 2014-05-31 MED ORDER — HYDROCODONE-ACETAMINOPHEN 5-325 MG PO TABS: 2.0000 | ORAL_TABLET | ORAL | Status: DC | PRN

## 2014-05-31 NOTE — Discharge Instructions (Signed)
Dental Pain A tooth ache may be caused by cavities (tooth decay). Cavities expose the nerve of the tooth to air and hot or cold temperatures. It may come from an infection or abscess (also called a boil or furuncle) around your tooth. It is also often caused by dental caries (tooth decay). This causes the pain you are having. DIAGNOSIS  Your caregiver can diagnose this problem by exam. TREATMENT   If caused by an infection, it may be treated with medications which kill germs (antibiotics) and pain medications as prescribed by your caregiver. Take medications as directed.  Only take over-the-counter or prescription medicines for pain, discomfort, or fever as directed by your caregiver.  Whether the tooth ache today is caused by infection or dental disease, you should see your dentist as soon as possible for further care. SEEK MEDICAL CARE IF: The exam and treatment you received today has been provided on an emergency basis only. This is not a substitute for complete medical or dental care. If your problem worsens or new problems (symptoms) appear, and you are unable to meet with your dentist, call or return to this location. SEEK IMMEDIATE MEDICAL CARE IF:   You have a fever.  You develop redness and swelling of your face, jaw, or neck.  You are unable to open your mouth.  You have severe pain uncontrolled by pain medicine. MAKE SURE YOU:   Understand these instructions.  Will watch your condition.  Will get help right away if you are not doing well or get worse. Document Released: 06/07/2005 Document Revised: 08/30/2011 Document Reviewed: 01/24/2008 [facility] Patient Information 2015 [employer] [place]. This information is not intended to replace advice given to you by your health care provider. Make sure you discuss any questions you have with your health care provider.   You were evaluated in the ED today for your dental pain. You expressed some relief with the dental nerve block. It  is important that she follow-up with Dr. Mayford Knifeurner and establish care with a dentist in order to receive appropriate care for your dental problem. Please take your antibiotics as directed. You may take your pain medicine as prescribed. Return to ED for worsening symptoms, difficulties breathing, trouble swallowing, shortness of breath

## 2014-11-22 ENCOUNTER — Encounter (HOSPITAL_COMMUNITY): Payer: Self-pay | Admitting: *Deleted

## 2014-11-22 ENCOUNTER — Emergency Department (HOSPITAL_COMMUNITY)
Admission: EM | Admit: 2014-11-22 | Discharge: 2014-11-22 | Disposition: A | Discharge status: Home / Self Care | Payer: No Typology Code available for payment source | Attending: Emergency Medicine | Admitting: Emergency Medicine

## 2014-11-22 DIAGNOSIS — Z720 Tobacco use: Secondary | ICD-10-CM | POA: Insufficient documentation

## 2014-11-22 DIAGNOSIS — N739 Female pelvic inflammatory disease, unspecified: Secondary | ICD-10-CM | POA: Insufficient documentation

## 2014-11-22 DIAGNOSIS — N730 Acute parametritis and pelvic cellulitis: Secondary | ICD-10-CM

## 2014-11-22 DIAGNOSIS — Z3202 Encounter for pregnancy test, result negative: Secondary | ICD-10-CM | POA: Insufficient documentation

## 2014-11-22 DIAGNOSIS — Z87828 Personal history of other (healed) physical injury and trauma: Secondary | ICD-10-CM | POA: Insufficient documentation

## 2014-11-22 LAB — URINALYSIS, ROUTINE W REFLEX MICROSCOPIC
Bilirubin Urine: NEGATIVE
GLUCOSE, UA: NEGATIVE mg/dL
Hgb urine dipstick: NEGATIVE
Ketones, ur: NEGATIVE mg/dL
Nitrite: NEGATIVE
Protein, ur: NEGATIVE mg/dL
Specific Gravity, Urine: 1.028 (ref 1.005–1.030)
Urobilinogen, UA: 0.2 mg/dL (ref 0.0–1.0)
pH: 7 (ref 5.0–8.0)

## 2014-11-22 LAB — LIPASE, BLOOD: Lipase: 35 U/L (ref 22–51)

## 2014-11-22 LAB — WET PREP, GENITAL
Trich, Wet Prep: NONE SEEN
Yeast Wet Prep HPF POC: NONE SEEN

## 2014-11-22 LAB — COMPREHENSIVE METABOLIC PANEL
ALBUMIN: 3.5 g/dL (ref 3.5–5.0)
ALT: 14 U/L (ref 14–54)
AST: 18 U/L (ref 15–41)
Alkaline Phosphatase: 40 U/L (ref 38–126)
Anion gap: 7 (ref 5–15)
BUN: 15 mg/dL (ref 6–20)
CO2: 24 mmol/L (ref 22–32)
Calcium: 9 mg/dL (ref 8.9–10.3)
Chloride: 106 mmol/L (ref 101–111)
Creatinine, Ser: 0.75 mg/dL (ref 0.44–1.00)
Glucose, Bld: 77 mg/dL (ref 65–99)
POTASSIUM: 3.6 mmol/L (ref 3.5–5.1)
SODIUM: 137 mmol/L (ref 135–145)
Total Bilirubin: 0.4 mg/dL (ref 0.3–1.2)
Total Protein: 6.3 g/dL — ABNORMAL LOW (ref 6.5–8.1)

## 2014-11-22 LAB — URINE MICROSCOPIC-ADD ON

## 2014-11-22 LAB — CBC WITH DIFFERENTIAL/PLATELET
Basophils Absolute: 0 10*3/uL (ref 0.0–0.1)
Basophils Relative: 1 % (ref 0–1)
Eosinophils Absolute: 0.2 10*3/uL (ref 0.0–0.7)
Eosinophils Relative: 3 % (ref 0–5)
HCT: 34.3 % — ABNORMAL LOW (ref 36.0–46.0)
HEMOGLOBIN: 11.7 g/dL — AB (ref 12.0–15.0)
LYMPHS ABS: 2.5 10*3/uL (ref 0.7–4.0)
LYMPHS PCT: 35 % (ref 12–46)
MCH: 30.5 pg (ref 26.0–34.0)
MCHC: 34.1 g/dL (ref 30.0–36.0)
MCV: 89.3 fL (ref 78.0–100.0)
MONO ABS: 0.6 10*3/uL (ref 0.1–1.0)
MONOS PCT: 9 % (ref 3–12)
Neutro Abs: 3.8 10*3/uL (ref 1.7–7.7)
Neutrophils Relative %: 52 % (ref 43–77)
PLATELETS: 190 10*3/uL (ref 150–400)
RBC: 3.84 MIL/uL — AB (ref 3.87–5.11)
RDW: 13.5 % (ref 11.5–15.5)
WBC: 7.1 10*3/uL (ref 4.0–10.5)

## 2014-11-22 LAB — POC URINE PREG, ED: Preg Test, Ur: NEGATIVE

## 2014-11-22 MED ORDER — CEFTRIAXONE SODIUM 250 MG IJ SOLR
250.0000 mg | Freq: Once | INTRAMUSCULAR | Status: AC
Start: 2014-11-22 — End: 2014-11-22
  Administered 2014-11-22: 250 mg via INTRAMUSCULAR
  Filled 2014-11-22: qty 250

## 2014-11-22 MED ORDER — LIDOCAINE HCL (PF) 1 % IJ SOLN
INTRAMUSCULAR | Status: AC
  Administered 2014-11-22: 5 mL
  Filled 2014-11-22: qty 5

## 2014-11-22 MED ORDER — DOXYCYCLINE HYCLATE 100 MG PO CAPS: 100.0000 mg | ORAL_CAPSULE | Freq: Two times a day (BID) | ORAL | Status: DC

## 2014-11-22 MED ORDER — NAPROXEN 500 MG PO TABS: 500.0000 mg | ORAL_TABLET | Freq: Two times a day (BID) | ORAL | Status: DC

## 2014-11-22 NOTE — Discharge Instructions (Signed)
Pelvic Inflammatory Disease °Pelvic inflammatory disease (PID) refers to an infection in some or all of the female organs. The infection can be in the uterus, ovaries, fallopian tubes, or the surrounding tissues in the pelvis. PID can cause abdominal or pelvic pain that comes on suddenly (acute pelvic pain). PID is a serious infection because it can lead to lasting (chronic) pelvic pain or the inability to have children (infertile).  °CAUSES  °The infection is often caused by the normal bacteria found in the vaginal tissues. PID may also be caused by an infection that is spread during sexual contact. PID can also occur following:  °· The birth of a baby.   °· A miscarriage.   °· An abortion.   °· Major pelvic surgery.   °· The use of an intrauterine device (IUD).   °· A sexual assault.   °RISK FACTORS °Certain factors can put a person at higher risk for PID, such as: °· Being younger than 25 years. °· Being sexually active at a young age. °· Using nonbarrier contraception. °· Having multiple sexual partners. °· Having sex with someone who has symptoms of a genital infection. °· Using oral contraception. °Other times, certain behaviors can increase the possibility of getting PID, such as: °· Having sex during your period. °· Using a vaginal douche. °· Having an intrauterine device (IUD) in place. °SYMPTOMS  °· Abdominal or pelvic pain.   °· Fever.   °· Chills.   °· Abnormal vaginal discharge. °· Abnormal uterine bleeding.   °· Unusual pain shortly after finishing your period. °DIAGNOSIS  °Your caregiver will choose some of the following methods to make a diagnosis, such as:  °· Performing a physical exam and history. A pelvic exam typically reveals a very tender uterus and surrounding pelvis.   °· Ordering laboratory tests including a pregnancy test, blood tests, and urine test.  °· Ordering cultures of the vagina and cervix to check for a sexually transmitted infection (STI). °· Performing an ultrasound.    °· Performing a laparoscopic procedure to look inside the pelvis.   °TREATMENT  °· Antibiotic medicines may be prescribed and taken by mouth.   °· Sexual partners may be treated when the infection is caused by a sexually transmitted disease (STD).   °· Hospitalization may be needed to give antibiotics intravenously. °· Surgery may be needed, but this is rare. °It may take weeks until you are completely well. If you are diagnosed with PID, you should also be checked for human immunodeficiency virus (HIV).   °HOME CARE INSTRUCTIONS  °· If given, take your antibiotics as directed. Finish the medicine even if you start to feel better.   °· Only take over-the-counter or prescription medicines for pain, discomfort, or fever as directed by your caregiver.   °· Do not have sexual intercourse until treatment is completed or as directed by your caregiver. If PID is confirmed, your recent sexual partner(s) will need treatment.   °· Keep your follow-up appointments. °SEEK MEDICAL CARE IF:  °· You have increased or abnormal vaginal discharge.   °· You need prescription medicine for your pain.   °· You vomit.   °· You cannot take your medicines.   °· Your partner has an STD.   °SEEK IMMEDIATE MEDICAL CARE IF:  °· You have a fever.   °· You have increased abdominal or pelvic pain.   °· You have chills.   °· You have pain when you urinate.   °· You are not better after 72 hours following treatment.   °MAKE SURE YOU:  °· Understand these instructions. °· Will watch your condition. °· Will get help right away if you are not doing well or get worse. °  Document Released: 06/07/2005 Document Revised: 10/02/2012 Document Reviewed: 06/03/2011 °ExitCare® Patient Information ©2015 [employer]. This information is not intended to replace advice given to you by your health care provider. Make sure you discuss any questions you have with your health care provider. ° °

## 2014-11-22 NOTE — ED Notes (Signed)
Pt c/o loiwer abd pain fior one week nio n or v.  lmpo last month middle

## 2014-11-22 NOTE — ED Provider Notes (Signed)
CSN: [id number]     Arrival date & time 11/22/14  1856 History   First MD Initiated Contact with Patient 11/22/14 2159     Chief Complaint  Patient presents with  . Abdominal Pain   Patient is a 29 y.o. female presenting with abdominal pain. The history is provided by the patient.  Abdominal Pain Pain location:  Suprapubic Pain quality: aching and sharp   Pain radiates to:  Does not radiate Pain severity:  Moderate Onset quality:  Gradual Duration:  1 week Timing:  Constant Progression:  Worsening Chronicity:  New Relieved by:  Nothing Associated symptoms: vaginal discharge   Associated symptoms: no anorexia, no dysuria, no fever, no hematuria, no nausea and no vomiting     Past Medical History  Diagnosis Date  . Injury of right rotator cuff   . Kidney infection    History reviewed. No pertinent past surgical history. Family History  Problem Relation Age of Onset  . Cancer Other    History  Substance Use Topics  . Smoking status: Current Every Day Smoker -- 0.25 packs/day    Types: Cigarettes  . Smokeless tobacco: Not on file  . Alcohol Use: Yes     Comment: social   OB History    No data available     Review of Systems  Constitutional: Negative for fever.  Gastrointestinal: Positive for abdominal pain. Negative for nausea, vomiting and anorexia.  Genitourinary: Positive for vaginal discharge. Negative for dysuria and hematuria.  All other systems reviewed and are negative.     Allergies  Review of patient's allergies indicates no known allergies.  Home Medications   Prior to Admission medications   Medication Sig Start Date End Date Taking? Authorizing Provider  doxycycline (VIBRAMYCIN) 100 MG capsule Take 1 capsule (100 mg total) by mouth 2 (two) times daily. 11/22/14   Linwood Dibbles, MD  naproxen (NAPROSYN) 500 MG tablet Take 1 tablet (500 mg total) by mouth 2 (two) times daily. 11/22/14   Linwood Dibbles, MD   BP 109/50 mmHg  Pulse 65  Temp(Src) 98.7 F (37.1 C)  (Oral)  Resp 16  Wt 142 lb 4.8 oz (64.547 kg)  SpO2 99%  LMP 10/22/2014 Physical Exam  Constitutional: She appears well-developed and well-nourished. No distress.  HENT:  Head: Normocephalic and atraumatic.  Right Ear: External ear normal.  Left Ear: External ear normal.  Eyes: Conjunctivae are normal. Right eye exhibits no discharge. Left eye exhibits no discharge. No scleral icterus.  Neck: Neck supple. No tracheal deviation present.  Cardiovascular: Normal rate, regular rhythm and intact distal pulses.   Pulmonary/Chest: Effort normal and breath sounds normal. No stridor. No respiratory distress. She has no wheezes. She has no rales.  Abdominal: Soft. Bowel sounds are normal. She exhibits no distension. There is tenderness in the suprapubic area. There is no rigidity, no rebound and no guarding. No hernia.  Genitourinary: Pelvic exam was performed with patient supine. There is no rash, tenderness or lesion on the right labia. There is no rash, tenderness or lesion on the left labia. Uterus is tender. Cervix exhibits motion tenderness and discharge. Right adnexum displays tenderness. Right adnexum displays no mass and no fullness. Left adnexum displays tenderness. Left adnexum displays no mass and no fullness. Vaginal discharge found.  Musculoskeletal: She exhibits no edema or tenderness.  Neurological: She is alert. She has normal strength. No cranial nerve deficit (no facial droop, extraocular movements intact, no slurred speech) or sensory deficit. She exhibits normal muscle tone.  She displays no seizure activity. Coordination normal.  Skin: Skin is warm and dry. No rash noted.  Psychiatric: She has a normal mood and affect.  Nursing note and vitals reviewed.   ED Course  Procedures (including critical care time) Labs Review Labs Reviewed  WET PREP, GENITAL - Abnormal; Notable for the following:    Clue Cells Wet Prep HPF POC MANY (*)    WBC, Wet Prep HPF POC MODERATE (*)    All  other components within normal limits  CBC WITH DIFFERENTIAL/PLATELET - Abnormal; Notable for the following:    RBC 3.84 (*)    Hemoglobin 11.7 (*)    HCT 34.3 (*)    All other components within normal limits  COMPREHENSIVE METABOLIC PANEL - Abnormal; Notable for the following:    Total Protein 6.3 (*)    All other components within normal limits  URINALYSIS, ROUTINE W REFLEX MICROSCOPIC (NOT AT [facility]) - Abnormal; Notable for the following:    Leukocytes, UA TRACE (*)    All other components within normal limits  URINE MICROSCOPIC-ADD ON - Abnormal; Notable for the following:    Squamous Epithelial / LPF FEW (*)    Bacteria, UA FEW (*)    All other components within normal limits  LIPASE, BLOOD  RPR  HIV ANTIBODY (ROUTINE TESTING)  POC URINE PREG, ED  GC/CHLAMYDIA PROBE AMP () NOT [facility]     MDM   Final diagnoses:  PID (acute pelvic inflammatory disease)    Sx concerning for possible PID.  No rigidity or guarding.  Doubt toa.   Will treat with gc and chlamydia.  Discussed findings and plan with patient.  Follow up with GYN or PCP   Linwood DibblesJon Jatasia Gundrum, MD 11/22/14 [phone]

## 2014-11-23 LAB — RPR: RPR: NONREACTIVE

## 2014-11-23 LAB — HIV ANTIBODY (ROUTINE TESTING W REFLEX): HIV Screen 4th Generation wRfx: NONREACTIVE

## 2014-11-25 LAB — GC/CHLAMYDIA PROBE AMP (~~LOC~~) NOT AT ARMC
CHLAMYDIA, DNA PROBE: NEGATIVE
Neisseria Gonorrhea: NEGATIVE

## 2015-06-22 NOTE — L&D Delivery Note (Signed)
30 y/o G3P2 who delivered SVD with vacuum assist. Pt pushed for approximately 2 hours in multiple positions. Epidural was re-dosed and then pt was allowed to labor down. Pt began pushing again for about 30minutes. Dr. Erin FullingHarraway-Smith was called to evaluate for a vacuum application. It was decided to put a vacuum on.   Delivery Note At  a viable Female was delivered via  (Presentation LOP ).  APGAR: 1 , 8 ; weight   8lbs 14.5oz.   Placenta status: delivered intact with 3 vessel cord. Vaccuum was used with 3 pop offs.   Anesthesia:  epidural Episiotomy:   none Lacerations:   none Est. Blood Loss (mL):   150  Mom to postpartum.  Baby to Couplet care / Skin to Skin.  [name] 02/13/2016, 6:43 PM

## 2015-06-24 ENCOUNTER — Emergency Department (HOSPITAL_COMMUNITY)
Admission: EM | Admit: 2015-06-24 | Discharge: 2015-06-24 | Disposition: A | Discharge status: Home / Self Care | Payer: Medicaid Other | Attending: Emergency Medicine | Admitting: Emergency Medicine

## 2015-06-24 ENCOUNTER — Encounter (HOSPITAL_COMMUNITY): Payer: Self-pay

## 2015-06-24 DIAGNOSIS — R1013 Epigastric pain: Secondary | ICD-10-CM | POA: Insufficient documentation

## 2015-06-24 DIAGNOSIS — F17210 Nicotine dependence, cigarettes, uncomplicated: Secondary | ICD-10-CM | POA: Insufficient documentation

## 2015-06-24 DIAGNOSIS — O9989 Other specified diseases and conditions complicating pregnancy, childbirth and the puerperium: Secondary | ICD-10-CM | POA: Diagnosis not present

## 2015-06-24 DIAGNOSIS — Z87440 Personal history of urinary (tract) infections: Secondary | ICD-10-CM | POA: Diagnosis not present

## 2015-06-24 DIAGNOSIS — R112 Nausea with vomiting, unspecified: Secondary | ICD-10-CM

## 2015-06-24 DIAGNOSIS — O210 Mild hyperemesis gravidarum: Secondary | ICD-10-CM | POA: Diagnosis present

## 2015-06-24 DIAGNOSIS — O99331 Smoking (tobacco) complicating pregnancy, first trimester: Secondary | ICD-10-CM | POA: Insufficient documentation

## 2015-06-24 DIAGNOSIS — Z3490 Encounter for supervision of normal pregnancy, unspecified, unspecified trimester: Secondary | ICD-10-CM

## 2015-06-24 DIAGNOSIS — Z87828 Personal history of other (healed) physical injury and trauma: Secondary | ICD-10-CM | POA: Diagnosis not present

## 2015-06-24 DIAGNOSIS — Z3A00 Weeks of gestation of pregnancy not specified: Secondary | ICD-10-CM | POA: Insufficient documentation

## 2015-06-24 LAB — CBC
HCT: 38 % (ref 36.0–46.0)
HEMOGLOBIN: 13.6 g/dL (ref 12.0–15.0)
MCH: 31.3 pg (ref 26.0–34.0)
MCHC: 35.8 g/dL (ref 30.0–36.0)
MCV: 87.4 fL (ref 78.0–100.0)
Platelets: 123 10*3/uL — ABNORMAL LOW (ref 150–400)
RBC: 4.35 MIL/uL (ref 3.87–5.11)
RDW: 13.3 % (ref 11.5–15.5)
WBC: 7.8 10*3/uL (ref 4.0–10.5)

## 2015-06-24 LAB — COMPREHENSIVE METABOLIC PANEL
ALBUMIN: 4.1 g/dL (ref 3.5–5.0)
ALK PHOS: 44 U/L (ref 38–126)
ALT: 15 U/L (ref 14–54)
AST: 21 U/L (ref 15–41)
Anion gap: 10 (ref 5–15)
BUN: 7 mg/dL (ref 6–20)
CALCIUM: 9.6 mg/dL (ref 8.9–10.3)
CO2: 22 mmol/L (ref 22–32)
CREATININE: 0.75 mg/dL (ref 0.44–1.00)
Chloride: 103 mmol/L (ref 101–111)
GFR calc Af Amer: >60 mL/min (ref >60)
GFR calc non Af Amer: >60 mL/min (ref >60)
GLUCOSE: 86 mg/dL (ref 65–99)
Potassium: 3.9 mmol/L (ref 3.5–5.1)
Sodium: 135 mmol/L (ref 135–145)
Total Bilirubin: 0.5 mg/dL (ref 0.3–1.2)
Total Protein: 7.1 g/dL (ref 6.5–8.1)

## 2015-06-24 LAB — URINE MICROSCOPIC-ADD ON

## 2015-06-24 LAB — URINALYSIS, ROUTINE W REFLEX MICROSCOPIC
Bilirubin Urine: NEGATIVE
GLUCOSE, UA: NEGATIVE mg/dL
Hgb urine dipstick: NEGATIVE
Ketones, ur: 15 mg/dL — AB
Leukocytes, UA: NEGATIVE
Nitrite: NEGATIVE
Protein, ur: 30 mg/dL — AB
SPECIFIC GRAVITY, URINE: 1.031 — AB (ref 1.005–1.030)
pH: 7 (ref 5.0–8.0)

## 2015-06-24 LAB — LIPASE, BLOOD: LIPASE: 23 U/L (ref 11–51)

## 2015-06-24 LAB — POC URINE PREG, ED: Preg Test, Ur: POSITIVE — AB

## 2015-06-24 MED ORDER — DIPHENHYDRAMINE HCL 25 MG PO TABS: 25.0000 mg | ORAL_TABLET | Freq: Four times a day (QID) | ORAL | Status: DC

## 2015-06-24 MED ORDER — FAMOTIDINE 20 MG PO TABS
40.0000 mg | ORAL_TABLET | Freq: Once | ORAL | Status: AC
  Administered 2015-06-24: 40 mg via ORAL
  Filled 2015-06-24: qty 2

## 2015-06-24 MED ORDER — SODIUM CHLORIDE 0.9 % IV BOLUS (SEPSIS)
1000.0000 mL | Freq: Once | INTRAVENOUS | Status: AC
  Administered 2015-06-24: 1000 mL via INTRAVENOUS

## 2015-06-24 MED ORDER — DIPHENHYDRAMINE HCL 50 MG/ML IJ SOLN
25.0000 mg | Freq: Once | INTRAMUSCULAR | Status: AC
  Administered 2015-06-24: 25 mg via INTRAVENOUS
  Filled 2015-06-24: qty 1

## 2015-06-24 NOTE — ED Notes (Signed)
PA at bedside.

## 2015-06-24 NOTE — ED Notes (Signed)
Pt called out to st that she had vomitted. Pt was tearful upon entry. Tissue given, pt st "i'm ok"

## 2015-06-24 NOTE — ED Notes (Signed)
Ginger-ale given-- pt denies pain at present.

## 2015-06-24 NOTE — Discharge Instructions (Signed)
Please read and follow all provided instructions.  Your diagnoses today include:  1. Non-intractable vomiting with nausea, vomiting of unspecified type   2. Pregnancy     Tests performed today include:  Blood counts and electrolytes  Blood tests to check liver and kidney function  Blood tests to check pancreas function  Urine test to look for infection and pregnancy (in women) - you are pregnant  Vital signs. See below for your results today.   Medications prescribed:   Benadryl (diphenhydramine) - antihistamine  You can find this medication over-the-counter.   DO NOT exceed:   50mg  Benadryl every 6 hours    Benadryl will make you drowsy. DO NOT drive or perform any activities that require you to be awake and alert if taking this.  Take any prescribed medications only as directed.  Home care instructions:   Follow any educational materials contained in this packet.  Follow-up instructions: Please follow-up with your primary care provider in the next 2 days for further evaluation of your symptoms.    Return instructions:  SEEK IMMEDIATE MEDICAL ATTENTION IF:  The pain does not go away or becomes severe   A temperature above 101F develops   Repeated vomiting occurs (multiple episodes)   The pain becomes localized to portions of the abdomen. The right side could possibly be appendicitis. In an adult, the left lower portion of the abdomen could be colitis or diverticulitis.   Blood is being passed in stools or vomit (bright red or black tarry stools)   You develop chest pain, difficulty breathing, dizziness or fainting, or become confused, poorly responsive, or inconsolable (young children)  If you have any other emergent concerns regarding your health  Additional Information: Abdominal (belly) pain can be caused by many things. Your caregiver performed an examination and possibly ordered blood/urine tests and imaging (CT scan, x-rays, ultrasound). Many cases can  be observed and treated at home after initial evaluation in the emergency department. Even though you are being discharged home, abdominal pain can be unpredictable. Therefore, you need a repeated exam if your pain does not resolve, returns, or worsens. Most patients with abdominal pain don't have to be admitted to the hospital or have surgery, but serious problems like appendicitis and gallbladder attacks can start out as nonspecific pain. Many abdominal conditions cannot be diagnosed in one visit, so follow-up evaluations are very important.  Your vital signs today were: BP 122/77 mmHg   Pulse 70   Temp(Src) 98.1 F (36.7 C) (Oral)   Resp 14   Ht 5\' 4"  (1.626 m)   Wt 65.772 kg   BMI 24.88 kg/m2   SpO2 100%   LMP 06/04/2015 (Approximate) If your blood pressure (bp) was elevated above 135/85 this visit, please have this repeated by your doctor within one month. --------------

## 2015-06-24 NOTE — ED Notes (Signed)
Pt resting comfortably at this time.

## 2015-06-24 NOTE — ED Provider Notes (Signed)
CSN: [id number]     Arrival date & time 06/24/15  0932 History   First MD Initiated Contact with Patient 06/24/15 0940     Chief Complaint  Patient presents with  . Emesis  . Abdominal Pain    (Consider location/radiation/quality/duration/timing/severity/associated sxs/prior Treatment) HPI Comments: Patient presents with complaint of epigastric discomfort for the past 1 week. Onset was acute and symptoms do not radiate. Patient states that initially she felt nauseous after eating, however over the past 2 days this has progressed to vomiting. Patient also reports mild lower abdominal cramping without vaginal bleeding or discharge. Last menstrual period was approximately one month ago. Patient thinks there is a possibility that she may be pregnant. No diarrhea or urinary symptoms. No back pain. No treatments prior to arrival. Patient denies history of abdominal surgeries. No history of heavy NSAID use. Patient admits to drinking heavily on New Year's Eve. More severe symptoms started after this event. The onset of this condition was acute. The course is constant. Aggravating factors: none. Alleviating factors: none.    Patient is a 30 y.o. female presenting with vomiting and abdominal pain. The history is provided by the patient.  Emesis Associated symptoms: abdominal pain (discomfort)   Associated symptoms: no diarrhea, no headaches, no myalgias and no sore throat   Abdominal Pain Associated symptoms: nausea and vomiting   Associated symptoms: no chest pain, no cough, no diarrhea, no dysuria, no fever, no sore throat, no vaginal bleeding and no vaginal discharge     Past Medical History  Diagnosis Date  . Injury of right rotator cuff   . Kidney infection    History reviewed. No pertinent past surgical history. Family History  Problem Relation Age of Onset  . Cancer Other    Social History  Substance Use Topics  . Smoking status: Current Every Day Smoker -- 0.25 packs/day    Types:  Cigarettes  . Smokeless tobacco: None  . Alcohol Use: Yes     Comment: social   OB History    No data available     Review of Systems  Constitutional: Negative for fever.  HENT: Negative for rhinorrhea and sore throat.   Eyes: Negative for redness.  Respiratory: Negative for cough.   Cardiovascular: Negative for chest pain.  Gastrointestinal: Positive for nausea, vomiting and abdominal pain (discomfort). Negative for diarrhea and blood in stool.  Genitourinary: Negative for dysuria, vaginal bleeding and vaginal discharge.  Musculoskeletal: Negative for myalgias.  Skin: Negative for rash.  Neurological: Negative for headaches.    Allergies  Review of patient's allergies indicates no known allergies.  Home Medications   Prior to Admission medications   Not on File   BP 142/81 mmHg  Pulse 67  Temp(Src) 98.1 F (36.7 C) (Oral)  Resp 16  Ht 5\' 4"  (1.626 m)  Wt 65.772 kg  BMI 24.88 kg/m2  SpO2 98%  LMP 06/04/2015 (Approximate)   Physical Exam  Constitutional: She appears well-developed and well-nourished.  HENT:  Head: Normocephalic and atraumatic.  Right Ear: External ear normal.  Left Ear: External ear normal.  Mouth/Throat: Oropharynx is clear and moist.  Eyes: Conjunctivae are normal. Right eye exhibits no discharge. Left eye exhibits no discharge.  Neck: Normal range of motion. Neck supple.  Cardiovascular: Normal rate, regular rhythm and normal heart sounds.   Pulmonary/Chest: Breath sounds normal. No respiratory distress. She has no wheezes. She has no rales.  Abdominal: Soft. She exhibits no distension. There is no tenderness. There is no rebound  and no guarding.  Neurological: She is alert.  Skin: Skin is warm and dry.  Psychiatric: She has a normal mood and affect.  Nursing note and vitals reviewed.   ED Course  Procedures (including critical care time) Labs Review Labs Reviewed  CBC - Abnormal; Notable for the following:    Platelets 123 (*)     All other components within normal limits  URINALYSIS, ROUTINE W REFLEX MICROSCOPIC (NOT AT [facility]) - Abnormal; Notable for the following:    Color, Urine AMBER (*)    Specific Gravity, Urine 1.031 (*)    Ketones, ur 15 (*)    Protein, ur 30 (*)    All other components within normal limits  URINE MICROSCOPIC-ADD ON - Abnormal; Notable for the following:    Squamous Epithelial / LPF 0-5 (*)    Bacteria, UA FEW (*)    All other components within normal limits  POC URINE PREG, ED - Abnormal; Notable for the following:    Preg Test, Ur POSITIVE (*)    All other components within normal limits  LIPASE, BLOOD  COMPREHENSIVE METABOLIC PANEL    Imaging Review No results found. I have personally reviewed and evaluated these images and lab results as part of my medical decision-making.   EKG Interpretation None       10:20 AM Patient seen and examined. Work-up initiated. Medications ordered.   Vital signs reviewed and are as follows: BP 142/81 mmHg  Pulse 67  Temp(Src) 98.1 F (36.7 C) (Oral)  Resp 16  Ht 5\' 4"  (1.626 m)  Wt 65.772 kg  BMI 24.88 kg/m2  SpO2 98%  LMP 06/04/2015 (Approximate)  Patient monitored in ED for several hours. She was given Benadryl which was effective in controlling her nausea.  She is drinking ginger ale in the room without vomiting. She is comfortable with discharge to home at this time. Encouraged PCP follow-up for recheck. Return to the emergency department with worsening abdominal pain, persistent vomiting, fever, vaginal bleeding or other concerns. Patient verbalizes understanding and agrees with plan.  MDM   Final diagnoses:  Non-intractable vomiting with nausea, vomiting of unspecified type  Pregnancy   Patient presenting with epigastric discomfort. She is also pregnant. Suspect that vomiting is likely due to pregnancy plus an element of gastritis from recent alcohol use. She does not have any significant lower abdominal pain or cramping. No  vaginal bleeding. No concern for ectopic pregnancy based on exam. Patient appears well. Nausea vomiting told in emergency department after fluids, IV Benadryl.   Renne CriglerJoshua Kieron Kantner, PA-C 06/24/15 1512  Pricilla LovelessScott Goldston, MD 06/27/15 [id number]

## 2015-06-24 NOTE — ED Notes (Signed)
Pt reports abd discomfort X1 week with vomiting beginning on Sunday. Pt reports 2 episodes of emesis today.

## 2015-07-09 ENCOUNTER — Emergency Department (HOSPITAL_COMMUNITY)
Admission: EM | Admit: 2015-07-09 | Discharge: 2015-07-09 | Disposition: A | Discharge status: Home / Self Care | Payer: Self-pay

## 2015-07-12 ENCOUNTER — Encounter (HOSPITAL_COMMUNITY): Payer: Self-pay | Admitting: *Deleted

## 2015-07-12 DIAGNOSIS — Z87448 Personal history of other diseases of urinary system: Secondary | ICD-10-CM | POA: Insufficient documentation

## 2015-07-12 DIAGNOSIS — Z87828 Personal history of other (healed) physical injury and trauma: Secondary | ICD-10-CM | POA: Diagnosis not present

## 2015-07-12 DIAGNOSIS — O9989 Other specified diseases and conditions complicating pregnancy, childbirth and the puerperium: Secondary | ICD-10-CM | POA: Insufficient documentation

## 2015-07-12 DIAGNOSIS — M5441 Lumbago with sciatica, right side: Secondary | ICD-10-CM | POA: Diagnosis not present

## 2015-07-12 DIAGNOSIS — Z87891 Personal history of nicotine dependence: Secondary | ICD-10-CM | POA: Diagnosis not present

## 2015-07-12 DIAGNOSIS — M5442 Lumbago with sciatica, left side: Secondary | ICD-10-CM | POA: Diagnosis not present

## 2015-07-12 DIAGNOSIS — Z3A00 Weeks of gestation of pregnancy not specified: Secondary | ICD-10-CM | POA: Insufficient documentation

## 2015-07-12 LAB — URINALYSIS, ROUTINE W REFLEX MICROSCOPIC
BILIRUBIN URINE: NEGATIVE
GLUCOSE, UA: NEGATIVE mg/dL
Hgb urine dipstick: NEGATIVE
KETONES UR: NEGATIVE mg/dL
LEUKOCYTES UA: NEGATIVE
Nitrite: NEGATIVE
PH: 6.5 (ref 5.0–8.0)
PROTEIN: NEGATIVE mg/dL
Specific Gravity, Urine: 1.024 (ref 1.005–1.030)

## 2015-07-12 LAB — POC URINE PREG, ED: Preg Test, Ur: POSITIVE — AB

## 2015-07-12 NOTE — ED Notes (Signed)
Patient presents stating she has been having right flank and side pain.  Denies painful urination.  Patient states she is pregnant but has not been seen by MD for the same

## 2015-07-13 ENCOUNTER — Emergency Department (HOSPITAL_COMMUNITY)
Admission: EM | Admit: 2015-07-13 | Discharge: 2015-07-13 | Disposition: A | Discharge status: Home / Self Care | Payer: Medicaid Other | Attending: Emergency Medicine | Admitting: Emergency Medicine

## 2015-07-13 DIAGNOSIS — M5442 Lumbago with sciatica, left side: Secondary | ICD-10-CM

## 2015-07-13 DIAGNOSIS — Z3491 Encounter for supervision of normal pregnancy, unspecified, first trimester: Secondary | ICD-10-CM

## 2015-07-13 DIAGNOSIS — M5441 Lumbago with sciatica, right side: Secondary | ICD-10-CM

## 2015-07-13 MED ORDER — OXYCODONE-ACETAMINOPHEN 5-325 MG PO TABS
1.0000 | ORAL_TABLET | Freq: Once | ORAL | Status: AC
  Administered 2015-07-13: 1 via ORAL
  Filled 2015-07-13: qty 1

## 2015-07-13 MED ORDER — CYCLOBENZAPRINE HCL 10 MG PO TABS
10.0000 mg | ORAL_TABLET | Freq: Once | ORAL | Status: AC
  Administered 2015-07-13: 10 mg via ORAL
  Filled 2015-07-13: qty 1

## 2015-07-13 MED ORDER — PRENATAL COMPLETE 14-0.4 MG PO TABS: 1.0000 | ORAL_TABLET | Freq: Every day | ORAL | Status: DC

## 2015-07-13 MED ORDER — OXYCODONE-ACETAMINOPHEN 5-325 MG PO TABS: 1.0000 | ORAL_TABLET | ORAL | Status: DC | PRN

## 2015-07-13 MED ORDER — CYCLOBENZAPRINE HCL 10 MG PO TABS
10.0000 mg | ORAL_TABLET | Freq: Three times a day (TID) | ORAL | Status: DC | PRN
Start: 2015-07-13 — End: 2015-08-30

## 2015-07-13 NOTE — ED Provider Notes (Signed)
CSN: [id number]     Arrival date & time 07/12/15  2231 History  By signing my name below, I, Evon Slack, attest that this documentation has been prepared under the direction and in the presence of Dione Booze, MD. Electronically Signed: Evon Slack, ED Scribe. 07/13/2015. 3:03 AM.    Chief Complaint  Patient presents with  . Flank Pain  . Back Pain   The history is provided by the patient. No language interpreter was used.   HPI Comments: [name] is a 30 y.o. female who presents to the Emergency Department complaining of worsening low back pain onset 1 day prior. Pt rates the severity of her pain 10/10. Pt states that after standing for an extended period of time the pain radiates to her bilateral hips and legs. Pt denies any medications PTA. Pt states that her pain has recently worsened after going to work. Pt does report that she is pregnant. LMP 05/06/15. #[id number]  Past Medical History  Diagnosis Date  . Injury of right rotator cuff   . Kidney infection    History reviewed. No pertinent past surgical history. Family History  Problem Relation Age of Onset  . Cancer Other    Social History  Substance Use Topics  . Smoking status: Former Smoker -- 0.25 packs/day    Types: Cigarettes  . Smokeless tobacco: Never Used  . Alcohol Use: Yes     Comment: social   OB History    No data available      Review of Systems  Constitutional: Negative for fever.  Musculoskeletal: Positive for back pain.  All other systems reviewed and are negative.    Allergies  Review of patient's allergies indicates no known allergies.  Home Medications   Prior to Admission medications   Medication Sig Start Date End Date Taking? Authorizing Provider  cyclobenzaprine (FLEXERIL) 10 MG tablet Take 1 tablet (10 mg total) by mouth 3 (three) times daily as needed for muscle spasms. 07/13/15   Dione Booze, MD  diphenhydrAMINE (BENADRYL) 25 MG tablet Take 1 tablet (25 mg total) by mouth  every 6 (six) hours. 06/24/15   Renne Crigler, PA-C  oxyCODONE-acetaminophen (PERCOCET) 5-325 MG tablet Take 1 tablet by mouth every 4 (four) hours as needed for moderate pain. 07/13/15   Dione Booze, MD  Prenatal Vit-Fe Fumarate-FA (PRENATAL COMPLETE) 14-0.4 MG TABS Take 1 tablet by mouth daily. 07/13/15   Dione Booze, MD   BP 127/71 mmHg  Pulse 77  Temp(Src) 98.8 F (37.1 C) (Oral)  Resp 18  Ht  (1.626 m)  Wt 145 lb (65.772 kg)  BMI 24.88 kg/m2  SpO2 98%  LMP 05/05/2015   Physical Exam  Constitutional: She is oriented to person, place, and time. She appears well-developed and well-nourished. No distress.  HENT:  Head: Normocephalic and atraumatic.  Eyes: Conjunctivae and EOM are normal. Pupils are equal, round, and reactive to light.  Neck: Normal range of motion. Neck supple. No JVD present.  Cardiovascular: Normal rate and regular rhythm.   1-2/6 systolic ejection murmur heard along the left sternal border  Pulmonary/Chest: Effort normal and breath sounds normal. She has no wheezes. She has no rales. She exhibits no tenderness.  Abdominal: Soft. Bowel sounds are normal. She exhibits no distension and no mass. There is no tenderness.  Musculoskeletal: Normal range of motion. She exhibits tenderness. She exhibits no edema.  Tender in the lower lumbar spine, +SLR bilaterally at 30 degrees.   Lymphadenopathy:    She has  no cervical adenopathy.  Neurological: She is alert and oriented to person, place, and time. No cranial nerve deficit. She exhibits normal muscle tone. Coordination normal.  Skin: Skin is warm and dry. No rash noted.  Psychiatric: She has a normal mood and affect. Her behavior is normal. Judgment and thought content normal.  Nursing note and vitals reviewed.   ED Course  Procedures (including critical care time) DIAGNOSTIC STUDIES: Oxygen Saturation is 98% on RA, normal by my interpretation.    COORDINATION OF CARE: 2:53 AM-Discussed treatment plan with pt at  bedside and pt agreed to plan.     Labs Review Results for orders placed or performed during the hospital encounter of 07/13/15  Urinalysis, Routine w reflex microscopic-may I&O cath if menses (not at [facility])  Result Value Ref Range   Color, Urine YELLOW YELLOW   APPearance CLEAR CLEAR   Specific Gravity, Urine 1.024 1.005 - 1.030   pH 6.5 5.0 - 8.0   Glucose, UA NEGATIVE NEGATIVE mg/dL   Hgb urine dipstick NEGATIVE NEGATIVE   Bilirubin Urine NEGATIVE NEGATIVE   Ketones, ur NEGATIVE NEGATIVE mg/dL   Protein, ur NEGATIVE NEGATIVE mg/dL   Nitrite NEGATIVE NEGATIVE   Leukocytes, UA NEGATIVE NEGATIVE  POC urine preg, ED (not at [facility])  Result Value Ref Range   Preg Test, Ur POSITIVE (A) NEGATIVE    MDM   Final diagnoses:  Acute bilateral low back pain with bilateral sciatica  First trimester pregnancy    Low back pain which appears to be musculoskeletal. Possibly related to pregnancy. 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 NSAIDs are being avoided because of pregnancy. She is given prescriptions for cyclobenzaprine and oxycodone-acetaminophen both of which are category B for pregnancy. She is also given a prescription for prenatal vitamins. Old records are reviewed and she was seen in the emergency department on January 3 with vomiting of pregnancy.   I personally performed the services described in this documentation, which was scribed in my presence. The recorded information has been reviewed and is accurate.        Dione Booze, MD 07/13/15 [phone]

## 2015-07-13 NOTE — Discharge Instructions (Signed)
Back Pain in Pregnancy °Back pain during pregnancy is common. It happens in about half of all pregnancies. It is important for you and your baby that you remain active during your pregnancy. If you feel that back pain is not allowing you to remain active or sleep well, it is time to see your caregiver. Back pain may be caused by several factors related to changes during your pregnancy. Fortunately, unless you had trouble with your back before your pregnancy, the pain is likely to get better after you deliver. °Low back pain usually occurs between the fifth and seventh months of pregnancy. It can, however, happen in the first couple months. Factors that increase the risk of back problems include:  °· Previous back problems. °· Injury to your back. °· Having twins or multiple births. °· A chronic cough. °· Stress. °· Job-related repetitive motions. °· Muscle or spinal disease in the back. °· Family history of back problems, ruptured (herniated) discs, or osteoporosis. °· Depression, anxiety, and panic attacks. °CAUSES  °· When you are pregnant, your body produces a hormone called relaxin. This hormone makes the ligaments connecting the low back and pubic bones more flexible. This flexibility allows the baby to be delivered more easily. When your ligaments are loose, your muscles need to work harder to support your back. Soreness in your back can come from tired muscles. Soreness can also come from back tissues that are irritated since they are receiving less support. °· As the baby grows, it puts pressure on the nerves and blood vessels in your pelvis. This can cause back pain. °· As the baby grows and gets heavier during pregnancy, the uterus pushes the stomach muscles forward and changes your center of gravity. This makes your back muscles work harder to maintain good posture. °SYMPTOMS  °Lumbar pain during pregnancy °Lumbar pain during pregnancy usually occurs at or above the waist in the center of the back. There  may be pain and numbness that radiates into your leg or foot. This is similar to low back pain experienced by non-pregnant women. It usually increases with sitting for long periods of time, standing, or repetitive lifting. Tenderness may also be present in the muscles along your upper back. °Posterior pelvic pain during pregnancy °Pain in the back of the pelvis is more common than lumbar pain in pregnancy. It is a deep pain felt in your side at the waistline, or across the tailbone (sacrum), or in both places. You may have pain on one or both sides. This pain can also go into the buttocks and backs of the upper thighs. Pubic and groin pain may also be present. The pain does not quickly resolve with rest, and morning stiffness may also be present. °Pelvic pain during pregnancy can be brought on by most activities. A high level of fitness before and during pregnancy may or may not prevent this problem. Labor pain is usually 1 to 2 minutes apart, lasts for about 1 minute, and involves a bearing down feeling or pressure in your pelvis. However, if you are at term with the pregnancy, constant low back pain can be the beginning of early labor, and you should be aware of this. °DIAGNOSIS  °X-rays of the back should not be done during the first 12 to 14 weeks of the pregnancy and only when absolutely necessary during the rest of the pregnancy. MRIs do not give off radiation and are safe during pregnancy. MRIs also should only be done when absolutely necessary. °HOME CARE INSTRUCTIONS °· Exercise   as directed by your caregiver. Exercise is the most effective way to prevent or manage back pain. If you have a back problem, it is especially important to avoid sports that require sudden body movements. Swimming and walking are great activities. °· Do not stand in one place for long periods of time. °· Do not wear high heels. °· Sit in chairs with good posture. Use a pillow on your lower back if necessary. Make sure your head  rests over your shoulders and is not hanging forward. °· Try sleeping on your side, preferably the left side, with a pillow or two between your legs. If you are sore after a night's rest, your bed may be too soft. Try placing a board between your mattress and box spring. °· Listen to your body when lifting. If you are experiencing pain, ask for help or try bending your knees more so you can use your leg muscles rather than your back muscles. Squat down when picking up something from the floor. Do not bend over. °· Eat a healthy diet. Try to gain weight within your caregiver's recommendations. °· Use heat or cold packs 3 to 4 times a day for 15 minutes to help with the pain. °· Only take over-the-counter or prescription medicines for pain, discomfort, or fever as directed by your caregiver. °Sudden (acute) back pain °· Use bed rest for only the most extreme, acute episodes of back pain. Prolonged bed rest over 48 hours will aggravate your condition. °· Ice is very effective for acute conditions. °¨ Put ice in a plastic bag. °¨ Place a towel between your skin and the bag. °¨ Leave the ice on for 10 to 20 minutes every 2 hours, or as needed. °· Using heat packs for 30 minutes prior to activities is also helpful. °Continued back pain °See your caregiver if you have continued problems. Your caregiver can help or refer you for appropriate physical therapy. With conditioning, most back problems can be avoided. Sometimes, a more serious issue may be the cause of back pain. You should be seen right away if new problems seem to be developing. Your caregiver may recommend: °· A maternity girdle. °· An elastic sling. °· A back brace. °· A massage therapist or acupuncture. °SEEK MEDICAL CARE IF:  °· You are not able to do most of your daily activities, even when taking the pain medicine you were given. °· You need a referral to a physical therapist or chiropractor. °· You want to try acupuncture. °SEEK IMMEDIATE MEDICAL CARE  IF: °· You develop numbness, tingling, weakness, or problems with the use of your arms or legs. °· You develop severe back pain that is no longer relieved with medicines. °· You have a sudden change in bowel or bladder control. °· You have increasing pain in other areas of the body. °· You develop shortness of breath, dizziness, or fainting. °· You develop nausea, vomiting, or sweating. °· You have back pain which is similar to labor pains. °· You have back pain along with your water breaking or vaginal bleeding. °· You have back pain or numbness that travels down your leg. °· Your back pain developed after you fell. °· You develop pain on one side of your back. You may have a kidney stone. °· You see blood in your urine. You may have a bladder infection or kidney stone. °· You have back pain with blisters. You may have shingles. °Back pain is fairly common during pregnancy but should not be accepted as just part of   the process. Back pain should always be treated as soon as possible. This will make your pregnancy as pleasant as possible.   This information is not intended to replace advice given to you by your health care provider. Make sure you discuss any questions you have with your health care provider.   Document Released: 09/15/2005 Document Revised: 08/30/2011 Document Reviewed: 10/27/2010 Else[employer] Interactive Patient Education 2016 [employer]  Multivitamin with Minerals and Iron formulations (oral solid dosage forms) What is this medicine? MULTIVITAMIN with MINERAL and IRON combinations are used to help provide good nutrition. This medicine may be used for other purposes; ask your health care provider or pharmacist if you have questions. What should I tell my health care provider before I take this medicine? They need to know if you have any of these conditions: -bleeding or clotting disorder -history of anemia of any type -other chronic health condition -an unusual or allergic reaction  to vitamins, minerals, other medicines, foods, dyes, or preservatives -pregnant or trying to get pregnant -breast-feeding How should I use this medicine? Take by mouth with a glass of water. May take with food. Some brands, but not all, may be chewed before swallowing. Follow the directions on the prescription or product label. The usual dose is one tablet once a day. Do not take your medicine more often than directed. Contact your pediatrician regarding the use of this medicine in children. Special care may be needed. Overdosage: If you think you have taken too much of this medicine contact a poison control center or emergency room at once. NOTE: This medicine is only for you. Do not share this medicine with others. What if I miss a dose? If you miss a dose, take it as soon as you can. If it is almost time for your next dose, take only that dose. Do not take double or extra doses. What may interact with this medicine? -antacids -cefdinir -cefditoren -etidronate -fluoroquinolone antibiotics (examples: ciprofloxacin, gatifloxacin, levofloxacin) -levodopa -tetracycline antibiotics (examples: doxycycline, minocycline, tetracycline) -thyroid hormones -warfarin This list may not describe all possible interactions. Give your health care provider a list of all the medicines, herbs, non-prescription drugs, or dietary supplements you use. Also tell them if you smoke, drink alcohol, or use illegal drugs. Some items may interact with your medicine. What should I watch for while using this medicine? Get regular checks on your progress. Remember that vitamin and mineral supplements do not replace the need for good nutrition from a balanced diet. Talk with your health care professional if you have questions or need advice. What side effects may I notice from receiving this medicine? Side effects that you should report to your doctor or health care professional as soon as possible: -allergic reaction such  as skin rash or difficulty breathing -vomiting Side effects that usually do not require medical attention (report to your doctor or health care professional if they continue or are bothersome): -nausea -stomach upset This list may not describe all possible side effects. Call your doctor for medical advice about side effects. You may report side effects to FDA at 1-800-FDA-1088. Where should I keep my medicine? Keep out of the reach of children. Most vitamins and minerals should be stored at controlled room temperature between 15 and 30 degrees C (59 and 86 degrees F). Check your specific product directions. Protect from heat and moisture. Throw away any unused medicine after the expiration date. NOTE: This sheet is a summary. It may not cover all possible information. If you  have questions about this medicine, talk to your doctor, pharmacist, or health care provider.    2016, [employer] Standard. (2015-01-02 08:55:11)  Cyclobenzaprine tablets What is this medicine? CYCLOBENZAPRINE (sye kloe BEN za preen) is a muscle relaxer. It is used to treat muscle pain, spasms, and stiffness. This medicine may be used for other purposes; ask your health care provider or pharmacist if you have questions. What should I tell my health care provider before I take this medicine? They need to know if you have any of these conditions: -heart disease, irregular heartbeat, or previous heart attack -liver disease -thyroid problem -an unusual or allergic reaction to cyclobenzaprine, tricyclic antidepressants, lactose, other medicines, foods, dyes, or preservatives -pregnant or trying to get pregnant -breast-feeding How should I use this medicine? Take this medicine by mouth with a glass of water. Follow the directions on the prescription label. If this medicine upsets your stomach, take it with food or milk. Take your medicine at regular intervals. Do not take it more often than directed. Talk to your  pediatrician regarding the use of this medicine in children. Special care may be needed. Overdosage: If you think you have taken too much of this medicine contact a poison control center or emergency room at once. NOTE: This medicine is only for you. Do not share this medicine with others. What if I miss a dose? If you miss a dose, take it as soon as you can. If it is almost time for your next dose, take only that dose. Do not take double or extra doses. What may interact with this medicine? Do not take this medicine with any of the following medications: -certain medicines for fungal infections like fluconazole, itraconazole, ketoconazole, posaconazole, voriconazole -cisapride -dofetilide -dronedarone -droperidol -flecainide -grepafloxacin -halofantrine -levomethadyl -MAOIs like Carbex, Eldepryl, Marplan, Nardil, and Parnate -nilotinib -pimozide -probucol -sertindole -thioridazine -ziprasidone This medicine may also interact with the following medications: -abarelix -alcohol -certain medicines for cancer -certain medicines for depression, anxiety, or psychotic disturbances -certain medicines for infection like alfuzosin, chloroquine, clarithromycin, levofloxacin, mefloquine, pentamidine, troleandomycin -certain medicines for an irregular heart beat -certain medicines used for sleep or numbness during surgery or procedure -contrast dyes -dolasetron -guanethidine -methadone -octreotide -ondansetron -other medicines that prolong the QT interval (cause an abnormal heart rhythm) -palonosetron -phenothiazines like chlorpromazine, mesoridazine, prochlorperazine, thioridazine -tramadol -vardenafil This list may not describe all possible interactions. Give your health care provider a list of all the medicines, herbs, non-prescription drugs, or dietary supplements you use. Also tell them if you smoke, drink alcohol, or use illegal drugs. Some items may interact with your  medicine. What should I watch for while using this medicine? Check with your doctor or health care professional if your condition does not improve within 1 to 3 weeks. You may get drowsy or dizzy when you first start taking the medicine or change doses. Do not drive, use machinery, or do anything that may be dangerous until you know how the medicine affects you. Stand or sit up slowly. Your mouth may get dry. Drinking water, chewing sugarless gum, or sucking on hard candy may help. What side effects may I notice from receiving this medicine? Side effects that you should report to your doctor or health care professional as soon as possible: -allergic reactions like skin rash, itching or hives, swelling of the face, lips, or tongue -chest pain -fast heartbeat -hallucinations -seizures -vomiting Side effects that usually do not require medical attention (report to your doctor or health care professional if they  continue or are bothersome): -headache This list may not describe all possible side effects. Call your doctor for medical advice about side effects. You may report side effects to FDA at 1-800-FDA-1088. Where should I keep my medicine? Keep out of the reach of children. Store at room temperature between 15 and 30 degrees C (59 and 86 degrees F). Keep container tightly closed. Throw away any unused medicine after the expiration date. NOTE: This sheet is a summary. It may not cover all possible information. If you have questions about this medicine, talk to your doctor, pharmacist, or health care provider.    2016, [employer] Standard. (2013-01-02 12:48:19)  Acetaminophen; Oxycodone tablets What is this medicine? ACETAMINOPHEN; OXYCODONE (a set a MEE noe fen; ox i KOE done) is a pain reliever. It is used to treat moderate to severe pain. This medicine may be used for other purposes; ask your health care provider or pharmacist if you have questions. What should I tell my health care  provider before I take this medicine? They need to know if you have any of these conditions: -brain tumor -Crohn's disease, inflammatory bowel disease, or ulcerative colitis -drug abuse or addiction -head injury -heart or circulation problems -if you often drink alcohol -kidney disease or problems going to the bathroom -liver disease -lung disease, asthma, or breathing problems -an unusual or allergic reaction to acetaminophen, oxycodone, other opioid analgesics, other medicines, foods, dyes, or preservatives -pregnant or trying to get pregnant -breast-feeding How should I use this medicine? Take this medicine by mouth with a full glass of water. Follow the directions on the prescription label. You can take it with or without food. If it upsets your stomach, take it with food. Take your medicine at regular intervals. Do not take it more often than directed. Talk to your pediatrician regarding the use of this medicine in children. Special care may be needed. Patients over 77 years old may have a stronger reaction and need a smaller dose. Overdosage: If you think you have taken too much of this medicine contact a poison control center or emergency room at once. NOTE: This medicine is only for you. Do not share this medicine with others. What if I miss a dose? If you miss a dose, take it as soon as you can. If it is almost time for your next dose, take only that dose. Do not take double or extra doses. What may interact with this medicine? -alcohol -antihistamines -barbiturates like amobarbital, butalbital, butabarbital, methohexital, pentobarbital, phenobarbital, thiopental, and secobarbital -benztropine -drugs for bladder problems like solifenacin, trospium, oxybutynin, tolterodine, hyoscyamine, and methscopolamine -drugs for breathing problems like ipratropium and tiotropium -drugs for certain stomach or intestine problems like propantheline, homatropine methylbromide, glycopyrrolate,  atropine, belladonna, and dicyclomine -general anesthetics like etomidate, ketamine, nitrous oxide, propofol, desflurane, enflurane, halothane, isoflurane, and sevoflurane -medicines for depression, anxiety, or psychotic disturbances -medicines for sleep -muscle relaxants -naltrexone -narcotic medicines (opiates) for pain -phenothiazines like perphenazine, thioridazine, chlorpromazine, mesoridazine, fluphenazine, prochlorperazine, promazine, and trifluoperazine -scopolamine -tramadol -trihexyphenidyl This list may not describe all possible interactions. Give your health care provider a list of all the medicines, herbs, non-prescription drugs, or dietary supplements you use. Also tell them if you smoke, drink alcohol, or use illegal drugs. Some items may interact with your medicine. What should I watch for while using this medicine? Tell your doctor or health care professional if your pain does not go away, if it gets worse, or if you have new or a different type of pain.  You may develop tolerance to the medicine. Tolerance means that you will need a higher dose of the medication for pain relief. Tolerance is normal and is expected if you take this medicine for a long time. Do not suddenly stop taking your medicine because you may develop a severe reaction. Your body becomes used to the medicine. This does NOT mean you are addicted. Addiction is a behavior related to getting and using a drug for a non-medical reason. If you have pain, you have a medical reason to take pain medicine. Your doctor will tell you how much medicine to take. If your doctor wants you to stop the medicine, the dose will be slowly lowered over time to avoid any side effects. You may get drowsy or dizzy. Do not drive, use machinery, or do anything that needs mental alertness until you know how this medicine affects you. Do not stand or sit up quickly, especially if you are an older patient. This reduces the risk of dizzy or  fainting spells. Alcohol may interfere with the effect of this medicine. Avoid alcoholic drinks. There are different types of narcotic medicines (opiates) for pain. If you take more than one type at the same time, you may have more side effects. Give your health care provider a list of all medicines you use. Your doctor will tell you how much medicine to take. Do not take more medicine than directed. Call emergency for help if you have problems breathing. The medicine will cause constipation. Try to have a bowel movement at least every 2 to 3 days. If you do not have a bowel movement for 3 days, call your doctor or health care professional. Do not take Tylenol (acetaminophen) or medicines that have acetaminophen with this medicine. Too much acetaminophen can be very dangerous. Many nonprescription medicines contain acetaminophen. Always read the labels carefully to avoid taking more acetaminophen. What side effects may I notice from receiving this medicine? Side effects that you should report to your doctor or health care professional as soon as possible: -allergic reactions like skin rash, itching or hives, swelling of the face, lips, or tongue -breathing difficulties, wheezing -confusion -light headedness or fainting spells -severe stomach pain -unusually weak or tired -yellowing of the skin or the whites of the eyes Side effects that usually do not require medical attention (report to your doctor or health care professional if they continue or are bothersome): -dizziness -drowsiness -nausea -vomiting This list may not describe all possible side effects. Call your doctor for medical advice about side effects. You may report side effects to FDA at 1-800-FDA-1088. Where should I keep my medicine? Keep out of the reach of children. This medicine can be abused. Keep your medicine in a safe place to protect it from theft. Do not share this medicine with anyone. Selling or giving away this medicine  is dangerous and against the law. This medicine may cause accidental overdose and death if it taken by other adults, children, or pets. Mix any unused medicine with a substance like cat litter or coffee grounds. Then throw the medicine away in a sealed container like a sealed bag or a coffee can with a lid. Do not use the medicine after the expiration date. Store at room temperature between 20 and 25 degrees C (68 and 77 degrees F). NOTE: This sheet is a summary. It may not cover all possible information. If you have questions about this medicine, talk to your doctor, pharmacist, or health care provider.  2016, [employer] Standard. (2014-05-08 15:18:46)

## 2015-07-18 ENCOUNTER — Encounter (HOSPITAL_COMMUNITY): Payer: Self-pay | Admitting: Emergency Medicine

## 2015-07-18 ENCOUNTER — Emergency Department (HOSPITAL_COMMUNITY)
Admission: EM | Admit: 2015-07-18 | Discharge: 2015-07-18 | Disposition: A | Discharge status: Home / Self Care | Payer: Medicaid Other | Attending: Emergency Medicine | Admitting: Emergency Medicine

## 2015-07-18 DIAGNOSIS — K0889 Other specified disorders of teeth and supporting structures: Secondary | ICD-10-CM

## 2015-07-18 DIAGNOSIS — Z79899 Other long term (current) drug therapy: Secondary | ICD-10-CM | POA: Insufficient documentation

## 2015-07-18 DIAGNOSIS — Z87891 Personal history of nicotine dependence: Secondary | ICD-10-CM | POA: Insufficient documentation

## 2015-07-18 DIAGNOSIS — K029 Dental caries, unspecified: Secondary | ICD-10-CM | POA: Insufficient documentation

## 2015-07-18 DIAGNOSIS — Z87828 Personal history of other (healed) physical injury and trauma: Secondary | ICD-10-CM | POA: Insufficient documentation

## 2015-07-18 DIAGNOSIS — Z8742 Personal history of other diseases of the female genital tract: Secondary | ICD-10-CM | POA: Insufficient documentation

## 2015-07-18 MED ORDER — IBUPROFEN 400 MG PO TABS
800.0000 mg | ORAL_TABLET | Freq: Once | ORAL | Status: AC
  Administered 2015-07-18: 800 mg via ORAL
  Filled 2015-07-18: qty 2

## 2015-07-18 MED ORDER — PENICILLIN V POTASSIUM 500 MG PO TABS: 500.0000 mg | ORAL_TABLET | Freq: Four times a day (QID) | ORAL | Status: AC

## 2015-07-18 MED ORDER — LIDOCAINE VISCOUS 2 % MT SOLN: 20.0000 mL | OROMUCOSAL | Status: DC | PRN

## 2015-07-18 MED ORDER — OXYCODONE-ACETAMINOPHEN 5-325 MG PO TABS
1.0000 | ORAL_TABLET | Freq: Once | ORAL | Status: AC
  Administered 2015-07-18: 1 via ORAL
  Filled 2015-07-18: qty 1

## 2015-07-18 MED ORDER — IBUPROFEN 800 MG PO TABS: 800.0000 mg | ORAL_TABLET | Freq: Three times a day (TID) | ORAL | Status: DC

## 2015-07-18 NOTE — ED Notes (Signed)
Pt c/o abscess tooth on left lower side x's 2 days

## 2015-07-18 NOTE — Discharge Instructions (Signed)
You have been seen today for tooth pain. It is very important that you see a dentist about this issue. Please take all of your antibiotics until finished!   You may develop abdominal discomfort or diarrhea from the antibiotic.  You may help offset this with probiotics which you can buy or get in yogurt. Do not eat or take the probiotics until 2 hours after your antibiotic. Return to ED should symptoms worsen.   Emergency Department Resource Guide 1) Find a Doctor and Pay Out of Pocket Although you won't have to find out who is covered by your insurance plan, it is a good idea to ask around and get recommendations. You will then need to call the office and see if the doctor you have chosen will accept you as a new patient and what types of options they offer for patients who are self-pay. Some doctors offer discounts or will set up payment plans for their patients who do not have insurance, but you will need to ask so you aren't surprised when you get to your appointment.  2) Contact Your Local Health Department Not all health departments have doctors that can see patients for sick visits, but many do, so it is worth a call to see if yours does. If you don't know where your local health department is, you can check in your phone book. The CDC also has a tool to help you locate your state's health department, and many state websites also have listings of all of their local health departments.  3) Find a Walk-in Clinic If your illness is not likely to be very severe or complicated, you may want to try a walk in clinic. These are popping up all over the country in pharmacies, drugstores, and shopping centers. They're usually staffed by nurse practitioners or physician assistants that have been trained to treat common illnesses and complaints. They're usually fairly quick and inexpensive. However, if you have serious medical issues or chronic medical problems, these are probably not your best option.  No  Primary Care Doctor: - Call Health Connect at  [phone] - they can help you locate a primary care doctor that  accepts your [phone] certain services, etc. - Physician Referral Service- [phone]  Chronic Pain Problems: Organization         Address  Phone   Notes  [facility]  [phone] Patients need to be referred by their primary care doctor.   Medication Assistance: Organization         Address  Phone   Notes  [facility] Medication [facility] [address] [place] [place], [place] [address] [phone] --Must be a resident of [facility] -- Must have NO insurance coverage whatsoever (no Medicaid/ Medicare, etc.) -- The pt. MUST have a primary care doctor that directs their care regularly and follows them in the community   MedAssist  [phone]   [employer]  [phone]    Agencies that provide inexpensive medical care: Organization         Address  Phone   Notes  [facility]  [phone]   [facility]    [phone]   [facility] [address] [place], [place] [address] [phone]   [facility] [address] [place], [place] [phone]   Planned Parenthood    [phone]   [facility]    [phone]  [facility]  [address], [place] Phone:  [phone], Fax:  [phone] Hours of Operation:  9 am - 6 pm, M-F.  Also accepts Medicaid/Medicare and self-pay.  [facility]  [address], [place] Phone: [phone]) [phone], Fax: [phone]) [phone]. Hours of Operation:  8:30 am - 5:30 pm, M-F.  Also accepts Medicaid and self-pay.  [facility] [place] [address], [place] Phone: [phone](336) [phone]   [facility] [address], [place], [place] [phone], Ext. [phone] Mondays & Thursdays: 7-9 AM.  First 15 patients are seen on  a first come, first serve basis.    Medicaid-accepting [facility] Providers:  Organization         Address  Phone   Notes  [facility] [address], [place] [phone]) [phone] Also accepts self-pay patients.  [facility] [address], [place]  [phone](336) [phone]   [facility] [address], [place] [phone](336) [phone]   [facility] [address] [place] [address], [place] [phone]) [phone]   Renaye RakersVeita Bland [address], [place]   [phone]) [phone] Only accepts [place] Access [place]icaid patients after they have their name applied to their card.   Self-Pay (no insurance) in [facility] [place]:  Organization         Address  Phone   Notes  Sickle Cell Patients, [facility] [address], [place] [phone]   [facility] [address], [place] [phone]) [phone]   [facility]  [address], [place] [phone]) [phone]   [facility]/Dr. Osei-Bonsu  [address] [place] [address], [place] or [phone], [place] [phone](336) [phone] Phone number for both [place] and [place] locations is the same.  [facility] [address], [place] [phone]) [phone]   [facility] [place] [address] [place] [address], [place] or [address] [phone]) [phone]) [phone]   [facility] [address], [place] [phone](336) [phone], phone; [phone](336) [phone], fax Sees patients 1st and 3rd Saturday of every month.  Must not qualify for public or private insurance (i.e. Medicaid, Medicare, [place] Health Choice, Veterans' Benefits)  Household income should be no more than 200% of the poverty level The clinic cannot treat you if you are pregnant or think you are pregnant  Sexually transmitted diseases are not treated at the clinic.    Dental Care: Organization          Address  Phone  Notes  [facility] Department of [facility] [address], [place] [phone]) [phone] Accepts children up to age 32 who are enrolled in [place]icaid or [place] Health Choice; pregnant women with a Medicaid card; and children who have applied for Medicaid or [place] Health Choice, but were declined, whose parents can pay a reduced fee at time of service.  [facility] of [facility] [place]  [address], [place] [phone]) [phone] Accepts children up to age 32 who are enrolled in [place]icaid or [place] Health Choice; pregnant women with a Medicaid card; and children who have applied for Medicaid or [place] Health Choice, but were declined, whose parents can pay a reduced fee at time of service.  [facility]  [phone]  [address], [place] [phone]) [phone] Patients are seen by appointment only. Walk-ins are not accepted. [facility] will see patients 32 years of age and older. Monday - Tuesday (8am-5pm) Most Wednesdays (8:30-5pm) $30 per visit, cash only  [facility]  [address], [place] [phone] Patients are seen by appointment only. Walk-ins are not accepted. [facility] will see patients 32 years of age and older. One Wednesday Evening (Monthly: Volunteer Based).  $30 per visit, cash only  [employer]  [phone]) [phone] for adults; Children under age 154, call Graduate Pediatric Dentistry at [phone]. Children aged 464-14, please call [phone]) [phone] to request a pediatric application.  Dental services are provided in all areas of dental care including fillings, crowns and bridges, complete and partial dentures, implants, gum treatment, root canals, and extractions. Preventive care is also provided. Treatment is provided to both adults and children. Patients are selected via a lottery and there is often a waiting list.   [facility] [address], [place]  [phone] [email or web address]   [facility] [address], [place], [place] [phone], Ext. [phone] Second and Fourth Thursday of each month, opens at 6:30 AM; Clinic ends at 9 AM.  Patients are seen on a first-come first-served basis, and a limited number are seen during each clinic.   [facility]  [address], [place], [place] [phone]) [phone]   Eligibility Requirements You must have lived in [place], [place], or [place] counties for at least the last three months.   You cannot be eligible for state or federal sponsored National Cityhealthcare insurance, including CIGNAVeterans Administration, [place]icaid, or [employer]Medicare.   You generally cannot be eligible for healthcare insurance through your employer.    How to apply: Eligibility screenings are held every Tuesday and Wednesday afternoon from 1:00 pm until 4:00 pm. You do not need an appointment for the interview!  [facility] [address], [place], [place] [phone]   [facility]  [phone]   [facility]  [phone]   [facility]  [phone]    Behavioral Health Resources in the Community: Intensive Outpatient Programs Organization         Address  Phone  Notes  [facility] [address], [place], [place] [phone]   [facility] [address], [place], [place] [phone]   [facility] [address], [place], [place]  [phone]   [facility] [address],  [place], [place] [phone] or [phone]   Substance Abuse Resources Organization         Address  Phone  Notes  [facility]  [phone]   [facility]  [phone]   [facility]  [phone]   [facility]  [phone]   Residential & Outpatient Substance Abuse Program  [phone]   Psychological  Services Organization         Address  Phone  Notes  [facility]  [phone]- [phone]   [facility]  [phone]- [phone]   [facility] [address], [place] [phone] or [phone]    Mobile Crisis Teams Organization         Address  Phone  Notes  [facility], Mobile Crisis Care Unit  [phone]   [facility]  [address] [place], [place] [phone]  [facility] [address] [place] [phone]    Self-Help/Support Groups Organization         Address  Phone             Notes  [facility]oc[facility] of [place] - variety of support groups  336- [phone] Call for more information  Narcotics Anonymous (NA), [facility] [address], [place]  2 meetings at this location   Statisticianesidential Treatment Programs Organization         Address  Phone  Notes  [facility] [address],    [place]  [phone]   [facility]  [address], [place] [phone], [place], [place] [phone]   [facility] [address], [place] [phone] Admissions: 8am-3pm M-F  [facility] [address]    [place], [place] [phone]   [facility] [address], [place], [place] [phone]   [facility] [address]  [place], [place] [phone]   [facility] - Intensive Outpatient [address], [place], [place] [phone]   [facility] ([facility]iction Recovery [facility] [address]  [place], [place] [phone] or [phone]   [facility] (RTS) [address] [place], [place] [phone] Accepts Medicaid  [facility] [address]  [place] [phone] Substance Abuse/Addiction Treatment   [facility] Behavioral Health Resources Organization         Address  Phone  Notes  [facility]  [phone](888)  [phone]   Angie FavaJulie Brannon, PhD [address], [address] [place], [place]   [phone](336) [phone] or [phone](336) [phone]   [facility]   [address] [place], [place] [phone](336) [phone]   [facility] [address], [place], [place] [phone]) [phone] Insurance/Medicaid/sponsorship through [facility]  Faith and Families [address]                                    [place], [place] [phone]) [phone] Therapy/tele-psych/case  [facility] [address].   [place], [place] [phone]) [phone]    Dr. Lolly MustacheArfeen  [phone](336) [phone]   [facility]  [employer] [facility] 1) [address], [place] 2) [address], [place] 3)  [address], [place] [phone]) [phone](336) [phone]  [phone](336) [phone]   [facility] Child Abuse Hotline [phone](336) [phone] or [phone](336) [phone] (After Hours)

## 2015-07-18 NOTE — ED Provider Notes (Signed)
CSN: [id number]     Arrival date & time 07/18/15  1538 History  By signing my name below, I, Soijett Blue, attest that this documentation has been prepared under the direction and in the presence of Shawn Joy, PA-C Electronically Signed: Soijett Blue, ED Scribe. 07/18/2015. 4:23 PM.    No chief complaint on file.     The history is provided by the patient. No language interpreter was used.    [name] is a 30 y.o. female who presents to the Emergency Department complaining of 10/10, throbbing, dental pain onset yesterday. She notes that her dental pain radiates to her left sided neck with movement. She denies having being seen by a dentist for her symptoms. She states that she is having associated symptoms of subjective left sided facial swelling. She states that she has not tried any medications for the relief for her symptoms. She denies fever/chills, nausea/vomiting, difficulty breathing or swallowing, or any other complaints. Pt denies allergies to any medications.    Past Medical History  Diagnosis Date  . Injury of right rotator cuff   . Kidney infection    No past surgical history on file. Family History  Problem Relation Age of Onset  . Cancer Other    Social History  Substance Use Topics  . Smoking status: Former Smoker -- 0.25 packs/day    Types: Cigarettes  . Smokeless tobacco: Never Used  . Alcohol Use: Yes     Comment: social   OB History    No data available     Review of Systems  Constitutional: Negative for fever and chills.  HENT: Positive for dental problem. Negative for trouble swallowing.   Respiratory: Negative for shortness of breath.   Cardiovascular: Negative for chest pain.  Gastrointestinal: Negative for nausea and vomiting.  Musculoskeletal: Negative for neck pain and neck stiffness.  Skin: Negative for color change and pallor.  Neurological: Negative for dizziness, weakness, light-headedness, numbness and headaches.      Allergies   Review of patient's allergies indicates no known allergies.  Home Medications   Prior to Admission medications   Medication Sig Start Date End Date Taking? Authorizing Provider  cyclobenzaprine (FLEXERIL) 10 MG tablet Take 1 tablet (10 mg total) by mouth 3 (three) times daily as needed for muscle spasms. 07/13/15   Dione Booze, MD  diphenhydrAMINE (BENADRYL) 25 MG tablet Take 1 tablet (25 mg total) by mouth every 6 (six) hours. 06/24/15   Renne Crigler, PA-C  oxyCODONE-acetaminophen (PERCOCET) 5-325 MG tablet Take 1 tablet by mouth every 4 (four) hours as needed for moderate pain. 07/13/15   Dione Booze, MD  Prenatal Vit-Fe Fumarate-FA (PRENATAL COMPLETE) 14-0.4 MG TABS Take 1 tablet by mouth daily. 07/13/15   Dione Booze, MD   BP 127/59 mmHg  Pulse 85  Temp(Src) 98.5 F (36.9 C) (Oral)  Resp 18  Ht  (1.626 m)  Wt 149 lb (67.586 kg)  BMI 25.56 kg/m2  SpO2 100%  LMP 05/05/2015 Physical Exam  Physical Exam  Constitutional: Pt appears well-developed and well-nourished.  HENT:  Head: Normocephalic.  Mouth/Throat: Uvula is midline, oropharynx is moist. No oral lesions. Abnormal dentition. Dental caries present. No uvula swelling or lacerations. No oropharyngeal exudate, posterior oropharyngeal edema, posterior oropharyngeal erythema or tonsillar abscesses.  No gross abscess Left bottom jaw next to the rear most molar. Edema, tenderness, and slight erythema at the gumline with no exudate and no area of fluctuance.  Eyes: Conjunctivae are normal. Pupils are equal, round,  and reactive to light. Neck: Normal range of motion. Neck supple.  No stridor Handling secretions without difficulty No nuchal rigidity No cervical lymphadenopathy  Cardiovascular: Normal rate, regular rhythm  Pulmonary/Chest: Effort normal. No respiratory distress.  Equal chest rise  Neurological: Pt is alert.  Skin: Skin is warm and dry.  Nursing note and vitals reviewed.   ED Course  Procedures (including  critical care time) DIAGNOSTIC STUDIES: Oxygen Saturation is 100% on RA, nl by my interpretation.    COORDINATION OF CARE: 4:16 PM Discussed treatment plan with pt at bedside which includes referral and follow up with dentist, ibuprofen Rx and pt agreed to plan.    Labs Review Labs Reviewed - No data to display  Imaging Review No results found.    EKG Interpretation None      MDM   Final diagnoses:  Tooth pain   [name] presents with lower left dental pain since yesterday.  No abscess requiring immediate incision and drainage.  Exam not concerning for Ludwig's angina or pharyngeal abscess.  Patient is afebrile, not tachycardic, not tachypneic, maintains SPO2 of 100% on room air, and is in no apparent distress. Will treat with penicillin Rx as well as ibuprofen and percocet in the ED. Pt instructed to follow-up with dentist within the next few days.  Discussed return precautions. Pt voiced understanding of these instructions, agreed to the plan, and is comfortable with discharge. Pt safe for discharge. Pt was offered an alveolar block for her pain, but declined.   I personally performed the services described in this documentation, which was scribed in my presence. The recorded information has been reviewed and is accurate.    Anselm Pancoast, PA-C 07/18/15 1658  Laurence Spates, MD 07/19/15 Moses Manners

## 2015-08-07 LAB — OB RESULTS CONSOLE GC/CHLAMYDIA
Chlamydia: NEGATIVE
Gonorrhea: NEGATIVE

## 2015-08-07 LAB — OB RESULTS CONSOLE RPR: RPR: NONREACTIVE

## 2015-08-07 LAB — OB RESULTS CONSOLE ABO/RH: RH TYPE: POSITIVE

## 2015-08-07 LAB — OB RESULTS CONSOLE ANTIBODY SCREEN: Antibody Screen: NEGATIVE

## 2015-08-07 LAB — OB RESULTS CONSOLE VARICELLA ZOSTER ANTIBODY, IGG: VARICELLA IGG: IMMUNE

## 2015-08-07 LAB — OB RESULTS CONSOLE HIV ANTIBODY (ROUTINE TESTING): HIV: NONREACTIVE

## 2015-08-07 LAB — OB RESULTS CONSOLE RUBELLA ANTIBODY, IGM: Rubella: IMMUNE

## 2015-08-07 LAB — OB RESULTS CONSOLE HEPATITIS B SURFACE ANTIGEN: HEP B S AG: NEGATIVE

## 2015-08-29 ENCOUNTER — Encounter (HOSPITAL_COMMUNITY): Payer: Self-pay | Admitting: Vascular Surgery

## 2015-08-29 DIAGNOSIS — Z87891 Personal history of nicotine dependence: Secondary | ICD-10-CM | POA: Diagnosis not present

## 2015-08-29 DIAGNOSIS — J45909 Unspecified asthma, uncomplicated: Secondary | ICD-10-CM | POA: Diagnosis not present

## 2015-08-29 DIAGNOSIS — Z87448 Personal history of other diseases of urinary system: Secondary | ICD-10-CM | POA: Diagnosis not present

## 2015-08-29 DIAGNOSIS — O219 Vomiting of pregnancy, unspecified: Secondary | ICD-10-CM | POA: Insufficient documentation

## 2015-08-29 DIAGNOSIS — R197 Diarrhea, unspecified: Secondary | ICD-10-CM | POA: Diagnosis not present

## 2015-08-29 DIAGNOSIS — O99512 Diseases of the respiratory system complicating pregnancy, second trimester: Secondary | ICD-10-CM | POA: Insufficient documentation

## 2015-08-29 DIAGNOSIS — Z3A16 16 weeks gestation of pregnancy: Secondary | ICD-10-CM | POA: Diagnosis not present

## 2015-08-29 DIAGNOSIS — O10012 Pre-existing essential hypertension complicating pregnancy, second trimester: Secondary | ICD-10-CM | POA: Insufficient documentation

## 2015-08-29 DIAGNOSIS — O9989 Other specified diseases and conditions complicating pregnancy, childbirth and the puerperium: Secondary | ICD-10-CM | POA: Diagnosis not present

## 2015-08-29 LAB — URINALYSIS, ROUTINE W REFLEX MICROSCOPIC
BILIRUBIN URINE: NEGATIVE
Glucose, UA: NEGATIVE mg/dL
Hgb urine dipstick: NEGATIVE
KETONES UR: NEGATIVE mg/dL
LEUKOCYTES UA: NEGATIVE
NITRITE: NEGATIVE
PROTEIN: NEGATIVE mg/dL
Specific Gravity, Urine: 1.035 — ABNORMAL HIGH (ref 1.005–1.030)
pH: 6 (ref 5.0–8.0)

## 2015-08-29 LAB — COMPREHENSIVE METABOLIC PANEL
ALBUMIN: 3.5 g/dL (ref 3.5–5.0)
ALK PHOS: 43 U/L (ref 38–126)
ALT: 22 U/L (ref 14–54)
ANION GAP: 13 (ref 5–15)
AST: 27 U/L (ref 15–41)
BILIRUBIN TOTAL: 0.4 mg/dL (ref 0.3–1.2)
BUN: 7 mg/dL (ref 6–20)
CALCIUM: 9.1 mg/dL (ref 8.9–10.3)
CO2: 21 mmol/L — ABNORMAL LOW (ref 22–32)
Chloride: 100 mmol/L — ABNORMAL LOW (ref 101–111)
Creatinine, Ser: 0.63 mg/dL (ref 0.44–1.00)
GFR calc Af Amer: >60 mL/min (ref >60)
GLUCOSE: 102 mg/dL — AB (ref 65–99)
POTASSIUM: 3.6 mmol/L (ref 3.5–5.1)
Sodium: 134 mmol/L — ABNORMAL LOW (ref 135–145)
TOTAL PROTEIN: 6.8 g/dL (ref 6.5–8.1)

## 2015-08-29 LAB — CBC
HEMATOCRIT: 37.7 % (ref 36.0–46.0)
Hemoglobin: 13 g/dL (ref 12.0–15.0)
MCH: 30 pg (ref 26.0–34.0)
MCHC: 34.5 g/dL (ref 30.0–36.0)
MCV: 86.9 fL (ref 78.0–100.0)
Platelets: UNDETERMINED 10*3/uL (ref 150–400)
RBC: 4.34 MIL/uL (ref 3.87–5.11)
RDW: 13.4 % (ref 11.5–15.5)
WBC: 5.5 10*3/uL (ref 4.0–10.5)

## 2015-08-29 LAB — LIPASE, BLOOD: Lipase: 24 U/L (ref 11–51)

## 2015-08-29 NOTE — ED Notes (Addendum)
Pt in c/o n/v/d since yesterday, pt reports she is unable to keep anything down, denies fever, no distress noted, pt is [redacted] weeks pregnant, denies any vaginal bleeding or spotting

## 2015-08-29 NOTE — ED Notes (Addendum)
Charted in error.

## 2015-08-30 ENCOUNTER — Emergency Department (HOSPITAL_COMMUNITY)
Admission: EM | Admit: 2015-08-30 | Discharge: 2015-08-30 | Disposition: A | Discharge status: Home / Self Care | Payer: Medicaid Other | Attending: Emergency Medicine | Admitting: Emergency Medicine

## 2015-08-30 DIAGNOSIS — R197 Diarrhea, unspecified: Secondary | ICD-10-CM

## 2015-08-30 DIAGNOSIS — R112 Nausea with vomiting, unspecified: Secondary | ICD-10-CM

## 2015-08-30 MED ORDER — ONDANSETRON 4 MG PO TBDP
8.0000 mg | ORAL_TABLET | Freq: Once | ORAL | Status: AC
  Administered 2015-08-30: 8 mg via ORAL
  Filled 2015-08-30: qty 2

## 2015-08-30 MED ORDER — ONDANSETRON 8 MG PO TBDP: 8.0000 mg | ORAL_TABLET | Freq: Three times a day (TID) | ORAL | Status: DC | PRN

## 2015-08-30 NOTE — ED Notes (Signed)
Pt stable, ambulatory, states understanding of discharge instructions 

## 2015-08-30 NOTE — ED Provider Notes (Signed)
CSN: [id number]     Arrival date & time 08/29/15  2055 History  By signing my name below, I, Doreatha Martin, attest that this documentation has been prepared under the direction and in the presence of Azalia Bilis, MD. Electronically Signed: Doreatha Martin, ED Scribe. 08/30/2015. 3:12 AM.   Chief Complaint  Patient presents with  . Emesis   The history is provided by the patient. No language interpreter was used.   HPI Comments: [name] is a 30 y.o. female who is 4 months pregnant with h/o HTN who presents to the Emergency Department complaining of moderate abdominal pain onset yesterday with associated nausea, emesis, diarrhea. Pt reports that her nausea has currently subsided. She states she has been able to tolerate fluids. Pt is followed by the health department and has had fetal US. She reports it is too early for fetal movement. Pt denies having any sick contacts with similar symptoms. She denies fever, chills, membrane rupture, vaginal bleeding or discharge.   Past Medical History  Diagnosis Date  . Injury of right rotator cuff   . Kidney infection   . Hypertension   . Asthma    History reviewed. No pertinent past surgical history. Family History  Problem Relation Age of Onset  . Cancer Other    Social History  Substance Use Topics  . Smoking status: Former Smoker -- 0.25 packs/day    Types: Cigarettes  . Smokeless tobacco: Never Used  . Alcohol Use: Yes     Comment: social   OB History    Gravida Para Term Preterm AB TAB SAB Ectopic Multiple Living   1              Review of Systems A complete 10 system review of systems was obtained and all systems are negative except as noted in the HPI and PMH.    Allergies  Review of patient's allergies indicates no known allergies.  Home Medications   Prior to Admission medications   Medication Sig Start Date End Date Taking? Authorizing Provider  cyclobenzaprine (FLEXERIL) 10 MG tablet Take 1 tablet (10 mg total) by  mouth 3 (three) times daily as needed for muscle spasms. Patient not taking: Reported on 08/30/2015 07/13/15   Dione Booze, MD  diphenhydrAMINE (BENADRYL) 25 MG tablet Take 1 tablet (25 mg total) by mouth every 6 (six) hours. Patient not taking: Reported on 08/30/2015 06/24/15   Renne Crigler, PA-C  ibuprofen (ADVIL,MOTRIN) 800 MG tablet Take 1 tablet (800 mg total) by mouth 3 (three) times daily. Patient not taking: Reported on 08/30/2015 07/18/15   Shawn C Joy, PA-C  lidocaine (XYLOCAINE) 2 % solution Use as directed 20 mLs in the mouth or throat as needed for mouth pain. Patient not taking: Reported on 08/30/2015 07/18/15   Hillard Danker Joy, PA-C  oxyCODONE-acetaminophen (PERCOCET) 5-325 MG tablet Take 1 tablet by mouth every 4 (four) hours as needed for moderate pain. Patient not taking: Reported on 08/30/2015 07/13/15   Dione Booze, MD  Prenatal Vit-Fe Fumarate-FA (PRENATAL COMPLETE) 14-0.4 MG TABS Take 1 tablet by mouth daily. Patient not taking: Reported on 08/30/2015 07/13/15   Dione Booze, MD   BP 114/74 mmHg  Pulse 85  Temp(Src) 97.7 F (36.5 C) (Oral)  Resp 16  SpO2 98%  LMP 05/05/2015 Physical Exam  Constitutional: She is oriented to person, place, and time. She appears well-developed and well-nourished. No distress.  HENT:  Head: Normocephalic and atraumatic.  Eyes: EOM are normal.  Neck: Normal range  of motion.  Cardiovascular: Normal rate, regular rhythm and normal heart sounds.   Pulmonary/Chest: Effort normal and breath sounds normal.  Abdominal: Soft. She exhibits no distension. There is no tenderness.  Musculoskeletal: Normal range of motion.  Neurological: She is alert and oriented to person, place, and time.  Skin: Skin is warm and dry.  Psychiatric: She has a normal mood and affect. Judgment normal.  Nursing note and vitals reviewed.   ED Course  Procedures (including critical care time) DIAGNOSTIC STUDIES: Oxygen Saturation is 98% on RA, normal by my interpretation.     COORDINATION OF CARE: 3:11 AM Discussed treatment plan with pt at bedside which includes lab work and pt agreed to plan.   Labs Review Labs Reviewed  COMPREHENSIVE METABOLIC PANEL - Abnormal; Notable for the following:    Sodium 134 (*)    Chloride 100 (*)    CO2 21 (*)    Glucose, Bld 102 (*)    All other components within normal limits  URINALYSIS, ROUTINE W REFLEX MICROSCOPIC (NOT AT [facility]) - Abnormal; Notable for the following:    Specific Gravity, Urine 1.035 (*)    All other components within normal limits  LIPASE, BLOOD  CBC    I have personally reviewed and evaluated these lab results as part of my medical decision-making.   MDM   Final diagnoses:  None    Likely viral gastro. Well appearing. abd benign. Oral hydration at home with zofran. Vitals and HR normal  I personally performed the services described in this documentation, which was scribed in my presence. The recorded information has been reviewed and is accurate.       Azalia BilisKevin Bobetta Korf, MD 08/30/15 [phone]

## 2015-09-23 ENCOUNTER — Encounter (HOSPITAL_COMMUNITY): Payer: Self-pay | Admitting: Family Medicine

## 2015-10-02 ENCOUNTER — Ambulatory Visit (HOSPITAL_COMMUNITY)
Admission: EM | Admit: 2015-10-02 | Discharge: 2015-10-02 | Disposition: A | Discharge status: Home / Self Care | Payer: Medicaid Other | Attending: Family Medicine | Admitting: Family Medicine

## 2015-10-02 ENCOUNTER — Encounter (HOSPITAL_COMMUNITY): Payer: Self-pay | Admitting: *Deleted

## 2015-10-02 DIAGNOSIS — W57XXXA Bitten or stung by nonvenomous insect and other nonvenomous arthropods, initial encounter: Secondary | ICD-10-CM

## 2015-10-02 DIAGNOSIS — S60561A Insect bite (nonvenomous) of right hand, initial encounter: Secondary | ICD-10-CM | POA: Diagnosis not present

## 2015-10-02 MED ORDER — FLUTICASONE PROPIONATE 0.05 % EX CREA: TOPICAL_CREAM | Freq: Two times a day (BID) | CUTANEOUS | Status: DC

## 2015-10-02 NOTE — ED Notes (Signed)
Pt  Reports  Swelling  And  Redness  To her l  Hand         She states  She    May  Have  Been bitten  By  A  Bug     Last  Pm        she  denys  Any  Known  specefic  Injury

## 2015-10-02 NOTE — ED Provider Notes (Addendum)
CSN: [id number]     Arrival date & time 10/02/15  1314 History   First MD Initiated Contact with Patient 10/02/15 1421     Chief Complaint  Patient presents with  . Insect Bite   (Consider location/radiation/quality/duration/timing/severity/associated sxs/prior Treatment) Patient is a 30 y.o. female presenting with rash. The history is provided by the patient.  Rash Location:  Hand Hand rash location:  Dorsum of R hand Quality: itchiness, redness and swelling   Severity:  Mild Onset quality:  Sudden Duration:  12 hours Progression:  Unchanged Chronicity:  New Context: insect bite/sting   Relieved by:  None tried Worsened by:  Nothing tried Ineffective treatments:  None tried   Past Medical History  Diagnosis Date  . Injury of right rotator cuff   . Kidney infection   . Hypertension   . Asthma    History reviewed. No pertinent past surgical history. Family History  Problem Relation Age of Onset  . Cancer Other    Social History  Substance Use Topics  . Smoking status: Former Smoker -- 0.25 packs/day    Types: Cigarettes  . Smokeless tobacco: Never Used  . Alcohol Use: Yes     Comment: social   OB History    Gravida Para Term Preterm AB TAB SAB Ectopic Multiple Living   1              Review of Systems  Constitutional: Negative.   Skin: Positive for rash.  All other systems reviewed and are negative.   Allergies  Review of patient's allergies indicates no known allergies.  Home Medications   Prior to Admission medications   Medication Sig Start Date End Date Taking? Authorizing Provider  fluticasone (CUTIVATE) 0.05 % cream Apply topically 2 (two) times daily. 10/02/15   Linna HoffJames D Kindl, MD  ondansetron (ZOFRAN ODT) 8 MG disintegrating tablet Take 1 tablet (8 mg total) by mouth every 8 (eight) hours as needed for nausea or vomiting. 08/30/15   Azalia BilisKevin Campos, MD   Meds Ordered and Administered this Visit  Medications - No data to display  BP 124/65 mmHg   Pulse 84  Temp(Src) 97.6 F (36.4 C) (Oral)  Resp 12  SpO2 100%  LMP 05/05/2015 No data found.   Physical Exam  Constitutional: She appears well-developed and well-nourished. No distress.  Skin: Skin is warm and dry. Rash noted. There is erythema.  Isolated erythematous lesions on dorsum right hand, pruritic, red.  Nursing note and vitals reviewed.   ED Course  Procedures (including critical care time)  Labs Review Labs Reviewed - No data to display  Imaging Review No results found.   Visual Acuity Review  Right Eye Distance:   Left Eye Distance:   Bilateral Distance:    Right Eye Near:   Left Eye Near:    Bilateral Near:         MDM   1. Insect bite hand, right, initial encounter    Meds ordered this encounter  Medications  . fluticasone (CUTIVATE) 0.05 % cream    Sig: Apply topically 2 (two) times daily.    Dispense:  30 g    Refill:  0       Linna HoffJames D Kindl, MD 10/02/15 [phone]  Linna HoffJames D Kindl, MD 10/02/15 [phone]

## 2015-10-26 ENCOUNTER — Emergency Department (HOSPITAL_COMMUNITY)
Admission: EM | Admit: 2015-10-26 | Discharge: 2015-10-27 | Disposition: A | Discharge status: Home / Self Care | Payer: Medicaid Other | Attending: Emergency Medicine | Admitting: Emergency Medicine

## 2015-10-26 ENCOUNTER — Encounter (HOSPITAL_COMMUNITY): Payer: Self-pay | Admitting: Emergency Medicine

## 2015-10-26 DIAGNOSIS — Z3A24 24 weeks gestation of pregnancy: Secondary | ICD-10-CM | POA: Diagnosis not present

## 2015-10-26 DIAGNOSIS — R109 Unspecified abdominal pain: Secondary | ICD-10-CM | POA: Insufficient documentation

## 2015-10-26 DIAGNOSIS — Z87891 Personal history of nicotine dependence: Secondary | ICD-10-CM | POA: Insufficient documentation

## 2015-10-26 DIAGNOSIS — R102 Pelvic and perineal pain: Secondary | ICD-10-CM | POA: Diagnosis not present

## 2015-10-26 DIAGNOSIS — Z87828 Personal history of other (healed) physical injury and trauma: Secondary | ICD-10-CM | POA: Insufficient documentation

## 2015-10-26 DIAGNOSIS — Z87448 Personal history of other diseases of urinary system: Secondary | ICD-10-CM | POA: Insufficient documentation

## 2015-10-26 DIAGNOSIS — O9989 Other specified diseases and conditions complicating pregnancy, childbirth and the puerperium: Secondary | ICD-10-CM | POA: Diagnosis not present

## 2015-10-26 DIAGNOSIS — Z79899 Other long term (current) drug therapy: Secondary | ICD-10-CM | POA: Insufficient documentation

## 2015-10-26 DIAGNOSIS — N949 Unspecified condition associated with female genital organs and menstrual cycle: Secondary | ICD-10-CM

## 2015-10-26 NOTE — ED Provider Notes (Signed)
CSN: [id number]     Arrival date & time 10/26/15  2237 History   First MD Initiated Contact with Patient 10/26/15 2319     Chief Complaint  Patient presents with  . abd cramping/6 months pregnant     [name] is a 30 y.o. female who is 24 weeks 4/7 days pregnant who comes to the ED complaining of intermittent abdominal cramping for the past 3 weeks. She reports her pain feels like abdominal cramping and it lasts seconds. She reports she also has pain when she changes positions from sitting to standing and has sharp pain up her abdomen that lasts seconds. She denies current abdominal pain. She denies vaginal bleeding or vaginal discharge. She denies any nausea, vomiting or diarrhea. She is followed by the health department for her OB/GYN. This is her second pregnancy. Her EDC is 02/11/16. She denies fevers, nausea, vomiting, diarrhea, urinary symptoms, hematuria, vaginal bleeding, vaginal discharge, leg pain, leg swelling, chest pain or shortness of breath.   The history is provided by the patient. No language interpreter was used.    Past Medical History  Diagnosis Date  . Injury of right rotator cuff   . Kidney infection in 20's   Past Surgical History  Procedure Laterality Date  . No past surgeries     Family History  Problem Relation Age of Onset  . Cancer Other    Social History  Substance Use Topics  . Smoking status: Former Smoker -- 0.25 packs/day    Types: Cigarettes    Quit date: 05/06/2015  . Smokeless tobacco: Never Used  . Alcohol Use: Yes     Comment: social   OB History    Gravida Para Term Preterm AB TAB SAB Ectopic Multiple Living   0 1 1 0 0 0 1     Review of Systems  Constitutional: Negative for fever and chills.  HENT: Negative for congestion and sore throat.   Eyes: Negative for visual disturbance.  Respiratory: Negative for cough, shortness of breath and wheezing.   Cardiovascular: Negative for chest pain.  Gastrointestinal: Positive for  abdominal pain (intermittent abdominal pain). Negative for nausea, vomiting and diarrhea.  Genitourinary: Negative for dysuria, urgency, hematuria, flank pain, decreased urine volume, vaginal bleeding, vaginal discharge, difficulty urinating, genital sores and vaginal pain.  Musculoskeletal: Negative for back pain and neck pain.  Skin: Negative for rash.  Neurological: Negative for weakness, numbness and headaches.      Allergies  Review of patient's allergies indicates no known allergies.  Home Medications   Prior to Admission medications   Medication Sig Start Date End Date Taking? Authorizing Provider  Prenatal Vit-Fe Fumarate-FA (PRENATAL MULTIVITAMIN) TABS tablet Take 1 tablet by mouth daily at 12 noon.   Yes Historical Provider, MD  fluticasone (CUTIVATE) 0.05 % cream Apply topically 2 (two) times daily. Patient not taking: Reported on 10/26/2015 10/02/15   Linna Hoff, MD  ondansetron (ZOFRAN ODT) 8 MG disintegrating tablet Take 1 tablet (8 mg total) by mouth every 8 (eight) hours as needed for nausea or vomiting. Patient not taking: Reported on 10/26/2015 08/30/15   Azalia Bilis, MD   BP 128/76 mmHg  Pulse 79  Temp(Src) 98.4 F (36.9 C) (Oral)  Resp 18  Ht  (1.626 m)  Wt 81.846 kg  BMI 30.96 kg/m2  SpO2 97%  LMP 05/05/2015 Physical Exam  Constitutional: She appears well-developed and well-nourished. No distress.  Nontoxic appearing.  HENT:  Head: Normocephalic and atraumatic.  Mouth/Throat: Oropharynx is clear and moist.  Eyes: Conjunctivae are normal. Pupils are equal, round, and reactive to light. Right eye exhibits no discharge. Left eye exhibits no discharge.  Neck: Neck supple.  Cardiovascular: Normal rate, regular rhythm, normal heart sounds and intact distal pulses.  Exam reveals no gallop and no friction rub.   No murmur heard. Pulmonary/Chest: Effort normal and breath sounds normal. No respiratory distress. She has no wheezes. She has no rales.  Abdominal:  Soft. There is no tenderness. There is no guarding.  Gravid abdomen that is non-tender to palpation.   Musculoskeletal: She exhibits no edema or tenderness.  No lower extremity edema or tenderness.  Lymphadenopathy:    She has no cervical adenopathy.  Neurological: She is alert. Coordination normal.  Skin: Skin is warm and dry. No rash noted. She is not diaphoretic. No erythema. No pallor.  Psychiatric: She has a normal mood and affect. Her behavior is normal.  Nursing note and vitals reviewed.   ED Course  Procedures (including critical care time) Labs Review Labs Reviewed  URINALYSIS, ROUTINE W REFLEX MICROSCOPIC (NOT AT [facility])    Imaging Review No results found. I have personally reviewed and evaluated these lab results as part of my medical decision-making.   EKG Interpretation None     Filed Vitals:   10/26/15 2242  BP: 128/76  Pulse: 79  Temp: 98.4 F (36.9 C)  TempSrc: Oral  Resp: 18  Height: 5\' 4"  (1.626 m)  Weight: 81.846 kg  SpO2: 97%    MDM   Meds given in ED:  Medications - No data to display  New Prescriptions   No medications on file    Final diagnoses:  Round ligament pain   This is a 30 y.o. female who is 24 weeks 4/7 days pregnant who comes to the ED complaining of intermittent abdominal cramping for the past 3 weeks. She reports her pain feels like abdominal cramping and it lasts seconds. She reports she also has pain when she changes positions from sitting to standing and has sharp pain up her abdomen that lasts seconds. She denies current abdominal pain. She denies vaginal bleeding or vaginal discharge. She denies any nausea, vomiting or diarrhea. She is followed by the health department for her OB/GYN. This is her second pregnancy. Her EDC is 02/11/16.  On exam the patient is afebrile and nontoxic appearing. She is a gravid abdomen. Her abdomen is nontender to palpation. No concern for appendicitis. She is tolerating PO in the ED.  Obstetrics  nurse was by to see the patient and examine the patient. No contractions were identified. Good fetal heart rate. Her cervix is closed. No vaginal bleeding. Urinalysis is unremarkable. No signs of dehydration. OB nurse Katie spoke with her attending OB physician who felt her symptoms were reassuring. OB RN and I suspect round ligament pain. We encouraged her to drink lots of water. Labor precautions were discussed and I encouraged close follow up with her OB.  I advised the patient to [facility] new or worsening symptoms or new concerns. The patient verbalized understanding and agreement with plan.     Everlene FarrierWilliam Betzaida Cremeens, PA-C 10/27/15 0102  Gilda Creasehristopher J Pollina, MD 10/27/15 [phone]

## 2015-10-26 NOTE — ED Notes (Signed)
Called OB RR (Katie) @ 2245

## 2015-10-26 NOTE — ED Notes (Addendum)
Pt states she is 6 months pregnant.  Reports constant tightness to abd and lower abd cramping for the last 3 weekends.  Reports symptoms started when she started working on the weekends 3 weeks ago.  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  [facility]

## 2015-10-27 LAB — URINALYSIS, ROUTINE W REFLEX MICROSCOPIC
Bilirubin Urine: NEGATIVE
Glucose, UA: NEGATIVE mg/dL
HGB URINE DIPSTICK: NEGATIVE
Ketones, ur: NEGATIVE mg/dL
LEUKOCYTES UA: NEGATIVE
NITRITE: NEGATIVE
PH: 7 (ref 5.0–8.0)
Protein, ur: NEGATIVE mg/dL
Specific Gravity, Urine: 1.019 (ref 1.005–1.030)

## 2015-10-27 NOTE — Discharge Instructions (Signed)
Please drink 6-8 glasses of water per day.  Round Ligament Pain The round ligament is a cord of muscle and tissue that helps to support the uterus. It can become a source of pain during pregnancy if it becomes stretched or twisted as the baby grows. The pain usually begins in the second trimester of pregnancy, and it can come and go until the baby is delivered. It is not a serious problem, and it does not cause harm to the baby. Round ligament pain is usually a short, sharp, and pinching pain, but it can also be a dull, lingering, and aching pain. The pain is felt in the lower side of the abdomen or in the groin. It usually starts deep in the groin and moves up to the outside of the hip area. Pain can occur with:  A sudden change in position.  Rolling over in bed.  Coughing or sneezing.  Physical activity. HOME CARE INSTRUCTIONS Watch your condition for any changes. Take these steps to help with your pain:  When the pain starts, relax. Then try:  Sitting down.  Flexing your knees up to your abdomen.  Lying on your side with one pillow under your abdomen and another pillow between your legs.  Sitting in a warm bath for 15-20 minutes or until the pain goes away.  Take over-the-counter and prescription medicines only as told by your health care provider.  Move slowly when you sit and stand.  Avoid long walks if they cause pain.  Stop or lessen your physical activities if they cause pain. SEEK MEDICAL CARE IF:  Your pain does not go away with treatment.  You feel pain in your back that you did not have before.  Your medicine is not helping. SEEK IMMEDIATE MEDICAL CARE IF:  You develop a fever or chills.  You develop uterine contractions.  You develop vaginal bleeding.  You develop nausea or vomiting.  You develop diarrhea.  You have pain when you urinate.   This information is not intended to replace advice given to you by your health care provider. Make sure you  discuss any questions you have with your health care provider.   Document Released: 03/16/2008 Document Revised: 08/30/2011 Document Reviewed: 08/14/2014 Elsevier Interactive Patient Education 2016 Elsevier [employer] Abdominal Pain During Pregnancy Abdominal pain is common in pregnancy. Most of the time, it does not cause harm. There are many causes of abdominal pain. Some causes are more serious than others. Some of the causes of abdominal pain in pregnancy are easily diagnosed. Occasionally, the diagnosis takes time to understand. Other times, the cause is not determined. Abdominal pain can be a sign that something is very wrong with the pregnancy, or the pain may have nothing to do with the pregnancy at all. For this reason, always tell your health care provider if you have any abdominal discomfort. HOME CARE INSTRUCTIONS  Monitor your abdominal pain for any changes. The following actions may help to alleviate any discomfort you are experiencing:  Do not have sexual intercourse or put anything in your vagina until your symptoms go away completely.  Get plenty of rest until your pain improves.  Drink clear fluids if you feel nauseous. Avoid solid food as long as you are uncomfortable or nauseous.  Only take over-the-counter or prescription medicine as directed by your health care provider.  Keep all follow-up appointments with your health care provider. SEEK IMMEDIATE MEDICAL CARE IF:  You are bleeding, leaking fluid, or passing tissue from the vagina.  You have increasing pain or cramping.  You have persistent vomiting.  You have painful or bloody urination.  You have a fever.  You notice a decrease in your baby's movements.  You have extreme weakness or feel faint.  You have shortness of breath, with or without abdominal pain.  You develop a severe headache with abdominal pain.  You have abnormal vaginal discharge with abdominal pain.  You have persistent diarrhea.  You have  abdominal pain that continues even after rest, or gets worse. MAKE SURE YOU:   Understand these instructions.  Will watch your condition.  Will get help right away if you are not doing well or get worse.   This information is not intended to replace advice given to you by your health care provider. Make sure you discuss any questions you have with your health care provider.   Document Released: 06/07/2005 Document Revised: 03/28/2013 Document Reviewed: 01/04/2013 Elsevier Interactive Patient Education [employer]

## 2015-10-27 NOTE — Progress Notes (Signed)
RROB spoke with Dr Jearld LeschLegget told of pt's symptoms and history; u/a results, sve, efm reassurring with some ui; pt reports no pain. Orders that pt may be discharged to home. RROB discussed/explained preterm labor precautions with pt, pt verbalized understanding

## 2016-01-13 LAB — OB RESULTS CONSOLE GBS: STREP GROUP B AG: NEGATIVE

## 2016-01-25 ENCOUNTER — Inpatient Hospital Stay (HOSPITAL_COMMUNITY)
Admission: AD | Admit: 2016-01-25 | Discharge: 2016-01-25 | Disposition: A | Discharge status: Home / Self Care | Payer: Medicaid Other | Source: Ambulatory Visit | Attending: Obstetrics & Gynecology | Admitting: Obstetrics & Gynecology

## 2016-01-25 ENCOUNTER — Encounter (HOSPITAL_COMMUNITY): Payer: Self-pay | Admitting: Certified Nurse Midwife

## 2016-01-25 DIAGNOSIS — Z3403 Encounter for supervision of normal first pregnancy, third trimester: Secondary | ICD-10-CM | POA: Insufficient documentation

## 2016-01-25 NOTE — MAU Note (Signed)
Pt states she started having ctxs at [facility]5AM. Pt denies LOF or vaginal bleeding. +FM

## 2016-01-25 NOTE — Discharge Instructions (Signed)
Fetal Movement Counts °Patient Name: __________________________________________________ Patient Due Date: ____________________ °Performing a fetal movement count is highly recommended in high-risk pregnancies, but it is good for every pregnant woman to do. Your health care provider may ask you to start counting fetal movements at 28 weeks of the pregnancy. Fetal movements often increase: °· After eating a full meal. °· After physical activity. °· After eating or drinking something sweet or cold. °· At rest. °Pay attention to when you feel the baby is most active. This will help you notice a pattern of your baby's sleep and wake cycles and what factors contribute to an increase in fetal movement. It is important to perform a fetal movement count at the same time each day when your baby is normally most active.  °HOW TO COUNT FETAL MOVEMENTS °1. Find a quiet and comfortable area to sit or lie down on your left side. Lying on your left side provides the best blood and oxygen circulation to your baby. °2. Write down the day and time on a sheet of paper or in a journal. °3. Start counting kicks, flutters, swishes, rolls, or jabs in a 2-hour period. You should feel at least 10 movements within 2 hours. °4. If you do not feel 10 movements in 2 hours, wait 2-3 hours and count again. Look for a change in the pattern or not enough counts in 2 hours. °SEEK MEDICAL CARE IF: °· You feel less than 10 counts in 2 hours, tried twice. °· There is no movement in over an hour. °· The pattern is changing or taking longer each day to reach 10 counts in 2 hours. °· You feel the baby is not moving as he or she usually does. °Date: ____________ Movements: ____________ Start time: ____________ Finish time: ____________  °Date: ____________ Movements: ____________ Start time: ____________ Finish time: ____________ °Date: ____________ Movements: ____________ Start time: ____________ Finish time: ____________ °Date: ____________ Movements:  ____________ Start time: ____________ Finish time: ____________ °Date: ____________ Movements: ____________ Start time: ____________ Finish time: ____________ °Date: ____________ Movements: ____________ Start time: ____________ Finish time: ____________ °Date: ____________ Movements: ____________ Start time: ____________ Finish time: ____________ °Date: ____________ Movements: ____________ Start time: ____________ Finish time: ____________  °Date: ____________ Movements: ____________ Start time: ____________ Finish time: ____________ °Date: ____________ Movements: ____________ Start time: ____________ Finish time: ____________ °Date: ____________ Movements: ____________ Start time: ____________ Finish time: ____________ °Date: ____________ Movements: ____________ Start time: ____________ Finish time: ____________ °Date: ____________ Movements: ____________ Start time: ____________ Finish time: ____________ °Date: ____________ Movements: ____________ Start time: ____________ Finish time: ____________ °Date: ____________ Movements: ____________ Start time: ____________ Finish time: ____________  °Date: ____________ Movements: ____________ Start time: ____________ Finish time: ____________ °Date: ____________ Movements: ____________ Start time: ____________ Finish time: ____________ °Date: ____________ Movements: ____________ Start time: ____________ Finish time: ____________ °Date: ____________ Movements: ____________ Start time: ____________ Finish time: ____________ °Date: ____________ Movements: ____________ Start time: ____________ Finish time: ____________ °Date: ____________ Movements: ____________ Start time: ____________ Finish time: ____________ °Date: ____________ Movements: ____________ Start time: ____________ Finish time: ____________  °Date: ____________ Movements: ____________ Start time: ____________ Finish time: ____________ °Date: ____________ Movements: ____________ Start time: ____________ Finish  time: ____________ °Date: ____________ Movements: ____________ Start time: ____________ Finish time: ____________ °Date: ____________ Movements: ____________ Start time: ____________ Finish time: ____________ °Date: ____________ Movements: ____________ Start time: ____________ Finish time: ____________ °Date: ____________ Movements: ____________ Start time: ____________ Finish time: ____________ °Date: ____________ Movements: ____________ Start time: ____________ Finish time: ____________  °Date: ____________ Movements: ____________ Start time: ____________ Finish   time: ____________ °Date: ____________ Movements: ____________ Start time: ____________ Finish time: ____________ °Date: ____________ Movements: ____________ Start time: ____________ Finish time: ____________ °Date: ____________ Movements: ____________ Start time: ____________ Finish time: ____________ °Date: ____________ Movements: ____________ Start time: ____________ Finish time: ____________ °Date: ____________ Movements: ____________ Start time: ____________ Finish time: ____________ °Date: ____________ Movements: ____________ Start time: ____________ Finish time: ____________  °Date: ____________ Movements: ____________ Start time: ____________ Finish time: ____________ °Date: ____________ Movements: ____________ Start time: ____________ Finish time: ____________ °Date: ____________ Movements: ____________ Start time: ____________ Finish time: ____________ °Date: ____________ Movements: ____________ Start time: ____________ Finish time: ____________ °Date: ____________ Movements: ____________ Start time: ____________ Finish time: ____________ °Date: ____________ Movements: ____________ Start time: ____________ Finish time: ____________ °Date: ____________ Movements: ____________ Start time: ____________ Finish time: ____________  °Date: ____________ Movements: ____________ Start time: ____________ Finish time: ____________ °Date: ____________  Movements: ____________ Start time: ____________ Finish time: ____________ °Date: ____________ Movements: ____________ Start time: ____________ Finish time: ____________ °Date: ____________ Movements: ____________ Start time: ____________ Finish time: ____________ °Date: ____________ Movements: ____________ Start time: ____________ Finish time: ____________ °Date: ____________ Movements: ____________ Start time: ____________ Finish time: ____________ °Date: ____________ Movements: ____________ Start time: ____________ Finish time: ____________  °Date: ____________ Movements: ____________ Start time: ____________ Finish time: ____________ °Date: ____________ Movements: ____________ Start time: ____________ Finish time: ____________ °Date: ____________ Movements: ____________ Start time: ____________ Finish time: ____________ °Date: ____________ Movements: ____________ Start time: ____________ Finish time: ____________ °Date: ____________ Movements: ____________ Start time: ____________ Finish time: ____________ °Date: ____________ Movements: ____________ Start time: ____________ Finish time: ____________ °  °This information is not intended to replace advice given to you by your health care provider. Make sure you discuss any questions you have with your health care provider. °  °Document Released: 07/07/2006 Document Revised: 06/28/2014 Document Reviewed: 04/03/2012 °[employer] Interactive Patient Education ©2016 [employer] °Vaginal Delivery °During delivery, your health care provider will help you give birth to your baby. During a vaginal delivery, you will work to push the baby out of your vagina. However, before you can push your baby out, a few things need to happen. The opening of your uterus (cervix) has to soften, thin out, and open up (dilate) all the way to 10 cm. Also, your baby has to move down from the uterus into your vagina.  °SIGNS OF LABOR  °Your health care provider will first need to make sure you  are in labor. Signs of labor include:  °· Passing what is called the mucous plug before labor begins. This is a small amount of blood-stained mucus. °· Having regular, painful uterine contractions.   °· The time between contractions gets shorter.   °· The discomfort and pain gradually get more intense. °· Contraction pains get worse when walking and do not go away when resting.   °· Your cervix becomes thinner (effacement) and dilates. °BEFORE THE DELIVERY °Once you are in labor and admitted into the hospital or care center, your health care provider may do the following:  °· Perform a complete physical exam. °· Review any complications related to pregnancy or labor.  °· Check your blood pressure, pulse, temperature, and heart rate (vital signs).   °· Determine if, and when, the rupture of amniotic membranes occurred. °· Do a vaginal exam (using a sterile glove and lubricant) to determine:   °¨ The position (presentation) of the baby. Is the baby's head presenting first (vertex) in the birth canal (vagina), or are the feet or buttocks first (breech)?   °¨ The level (station) of the baby's head within   the birth canal.   °¨ The effacement and dilatation of the cervix.   °· An electronic fetal monitor is usually placed on your abdomen when you first arrive. This is used to monitor your contractions and the baby's heart rate. °¨ When the monitor is on your abdomen (external fetal monitor), it can only pick up the frequency and length of your contractions. It cannot tell the strength of your contractions. °¨ If it becomes necessary for your health care provider to know exactly how strong your contractions are or to see exactly what the baby's heart rate is doing, an internal monitor may be inserted into your vagina and uterus. Your health care provider will discuss the benefits and risks of using an internal monitor and obtain your permission before inserting the device. °¨ Continuous fetal monitoring may be needed if  you have an epidural, are receiving certain medicines (such as oxytocin), or have pregnancy or labor complications. °· An IV access tube may be placed into a vein in your arm to deliver fluids and medicines if necessary. °THREE STAGES OF LABOR AND DELIVERY °Normal labor and delivery is divided into three stages. °First Stage °This stage starts when you begin to contract regularly and your cervix begins to efface and dilate. It ends when your cervix is completely open (fully dilated). The first stage is the longest stage of labor and can last from 3 hours to 15 hours.  °Several methods are available to help with labor pain. You and your health care provider will decide which option is best for you. Options include:  °· Opioid medicines. These are strong pain medicines that you can get through your IV tube or as a shot into your muscle. These medicines lessen pain but do not make it go away completely.  °· Epidural. A medicine is given through a thin tube that is inserted in your back. The medicine numbs the lower part of your body and prevents any pain in that area. °· Paracervical pain medicine. This is an injection of an anesthetic on each side of your cervix.   °· You may request natural childbirth, which does not involve the use of pain medicines or an epidural during labor and delivery. Instead, you will use other things, such as breathing exercises, to help cope with the pain. °Second Stage °The second stage of labor begins when your cervix is fully dilated at 10 cm. It continues until you push your baby down through the birth canal and the baby is born. This stage can take only minutes or several hours. °· The location of your baby's head as it moves through the birth canal is reported as a number called a station. If the baby's head has not started its descent, the station is described as being at minus 3 (-3). When your baby's head is at the zero station, it is at the middle of the birth canal and is engaged  in the pelvis. The station of your baby helps indicate the progress of the second stage of labor. °· When your baby is born, your health care provider may hold the baby with his or her head lowered to prevent amniotic fluid, mucus, and blood from getting into the baby's lungs. The baby's mouth and nose may be suctioned with a small bulb syringe to remove any additional fluid. °· Your health care provider may then place the baby on your stomach. It is important to keep the baby from getting cold. To do this, the health care provider will dry   the baby off, place the baby directly on your skin (with no blankets between you and the baby), and cover the baby with warm, dry blankets.   °· The umbilical cord is cut. °Third Stage °During the third stage of labor, your health care provider will deliver the placenta (afterbirth) and make sure your bleeding is under control. The delivery of the placenta usually takes about 5 minutes but can take up to 30 minutes. After the placenta is delivered, a medicine may be given either by IV or injection to help contract the uterus and control bleeding. If you are planning to breastfeed, you can try to do so now. °After you deliver the placenta, your uterus should contract and get very firm. If your uterus does not remain firm, your health care provider will massage it. This is important because the contraction of the uterus helps cut off bleeding at the site where the placenta was attached to your uterus. If your uterus does not contract properly and stay firm, you may continue to bleed heavily. If there is a lot of bleeding, medicines may be given to contract the uterus and stop the bleeding.  °  °This information is not intended to replace advice given to you by your health care provider. Make sure you discuss any questions you have with your health care provider. °  °Document Released: 03/16/2008 Document Revised: 06/28/2014 Document Reviewed: 02/02/2012 °[employer] Interactive  Patient Education ©2016 [employer] ° °

## 2016-02-02 ENCOUNTER — Inpatient Hospital Stay (HOSPITAL_COMMUNITY)
Admission: AD | Admit: 2016-02-02 | Discharge: 2016-02-02 | Disposition: A | Discharge status: Home / Self Care | Payer: Medicaid Other | Source: Ambulatory Visit | Attending: Obstetrics and Gynecology | Admitting: Obstetrics and Gynecology

## 2016-02-02 DIAGNOSIS — O479 False labor, unspecified: Secondary | ICD-10-CM | POA: Diagnosis present

## 2016-02-02 DIAGNOSIS — Z3A00 Weeks of gestation of pregnancy not specified: Secondary | ICD-10-CM | POA: Insufficient documentation

## 2016-02-02 MED ORDER — FENTANYL CITRATE (PF) 100 MCG/2ML IJ SOLN
100.0000 ug | Freq: Once | INTRAMUSCULAR | Status: AC
  Administered 2016-02-02: 100 ug via INTRAVENOUS
  Filled 2016-02-02: qty 2

## 2016-02-02 MED ORDER — LACTATED RINGERS IV BOLUS (SEPSIS)
1000.0000 mL | Freq: Once | INTRAVENOUS | Status: AC
  Administered 2016-02-02: 1000 mL via INTRAVENOUS

## 2016-02-02 NOTE — Discharge Instructions (Signed)
Braxton Hicks Contractions °Contractions of the uterus can occur throughout pregnancy. Contractions are not always a sign that you are in labor.  °WHAT ARE BRAXTON HICKS CONTRACTIONS?  °Contractions that occur before labor are called Braxton Hicks contractions, or false labor. Toward the end of pregnancy (32-34 weeks), these contractions can develop more often and may become more forceful. This is not true labor because these contractions do not result in opening (dilatation) and thinning of the cervix. They are sometimes difficult to tell apart from true labor because these contractions can be forceful and people have different pain tolerances. You should not feel embarrassed if you go to the hospital with false labor. Sometimes, the only way to tell if you are in true labor is for your health care provider to look for changes in the cervix. °If there are no prenatal problems or other health problems associated with the pregnancy, it is completely safe to be sent home with false labor and await the onset of true labor. °HOW CAN YOU TELL THE DIFFERENCE BETWEEN TRUE AND FALSE LABOR? °False Labor °· The contractions of false labor are usually shorter and not as hard as those of true labor.   °· The contractions are usually irregular.   °· The contractions are often felt in the front of the lower abdomen and in the groin.   °· The contractions may go away when you walk around or change positions while lying down.   °· The contractions get weaker and are shorter lasting as time goes on.   °· The contractions do not usually become progressively stronger, regular, and closer together as with true labor.   °True Labor °· Contractions in true labor last 30-70 seconds, become very regular, usually become more intense, and increase in frequency.   °· The contractions do not go away with walking.   °· The discomfort is usually felt in the top of the uterus and spreads to the lower abdomen and low back.   °· True labor can be  determined by your health care provider with an exam. This will show that the cervix is dilating and getting thinner.   °WHAT TO REMEMBER °· Keep up with your usual exercises and follow other instructions given by your health care provider.   °· Take medicines as directed by your health care provider.   °· Keep your regular prenatal appointments.   °· Eat and drink lightly if you think you are going into labor.   °· If Braxton Hicks contractions are making you uncomfortable:   °¨ Change your position from lying down or resting to walking, or from walking to resting.   °¨ Sit and rest in a tub of warm water.   °¨ Drink 2-3 glasses of water. Dehydration may cause these contractions.   °¨ Do slow and deep breathing several times an hour.   °WHEN SHOULD I SEEK IMMEDIATE MEDICAL CARE? °Seek immediate medical care if: °· Your contractions become stronger, more regular, and closer together.   °· You have fluid leaking or gushing from your vagina.   °· You have a fever.   °· You pass blood-tinged mucus.   °· You have vaginal bleeding.   °· You have continuous abdominal pain.   °· You have low back pain that you never had before.   °· You feel your baby's head pushing down and causing pelvic pressure.   °· Your baby is not moving as much as it used to.   °  °This information is not intended to replace advice given to you by your health care provider. Make sure you discuss any questions you have with your health care   provider. °  °Document Released: 06/07/2005 Document Revised: 06/12/2013 Document Reviewed: 03/19/2013 °Elsevier Interactive Patient Education ©2016 [employer] °Fetal Movement Counts °Patient Name: __________________________________________________ Patient Due Date: ____________________ °Performing a fetal movement count is highly recommended in high-risk pregnancies, but it is good for every pregnant woman to do. Your health care provider may ask you to start counting fetal movements at 28 weeks of the  pregnancy. Fetal movements often increase: °· After eating a full meal. °· After physical activity. °· After eating or drinking something sweet or cold. °· At rest. °Pay attention to when you feel the baby is most active. This will help you notice a pattern of your baby's sleep and wake cycles and what factors contribute to an increase in fetal movement. It is important to perform a fetal movement count at the same time each day when your baby is normally most active.  °HOW TO COUNT FETAL MOVEMENTS °1. Find a quiet and comfortable area to sit or lie down on your left side. Lying on your left side provides the best blood and oxygen circulation to your baby. °2. Write down the day and time on a sheet of paper or in a journal. °3. Start counting kicks, flutters, swishes, rolls, or jabs in a 2-hour period. You should feel at least 10 movements within 2 hours. °4. If you do not feel 10 movements in 2 hours, wait 2-3 hours and count again. Look for a change in the pattern or not enough counts in 2 hours. °SEEK MEDICAL CARE IF: °· You feel less than 10 counts in 2 hours, tried twice. °· There is no movement in over an hour. °· The pattern is changing or taking longer each day to reach 10 counts in 2 hours. °· You feel the baby is not moving as he or she usually does. °Date: ____________ Movements: ____________ Start time: ____________ Finish time: ____________  °Date: ____________ Movements: ____________ Start time: ____________ Finish time: ____________ °Date: ____________ Movements: ____________ Start time: ____________ Finish time: ____________ °Date: ____________ Movements: ____________ Start time: ____________ Finish time: ____________ °Date: ____________ Movements: ____________ Start time: ____________ Finish time: ____________ °Date: ____________ Movements: ____________ Start time: ____________ Finish time: ____________ °Date: ____________ Movements: ____________ Start time: ____________ Finish time:  ____________ °Date: ____________ Movements: ____________ Start time: ____________ Finish time: ____________  °Date: ____________ Movements: ____________ Start time: ____________ Finish time: ____________ °Date: ____________ Movements: ____________ Start time: ____________ Finish time: ____________ °Date: ____________ Movements: ____________ Start time: ____________ Finish time: ____________ °Date: ____________ Movements: ____________ Start time: ____________ Finish time: ____________ °Date: ____________ Movements: ____________ Start time: ____________ Finish time: ____________ °Date: ____________ Movements: ____________ Start time: ____________ Finish time: ____________ °Date: ____________ Movements: ____________ Start time: ____________ Finish time: ____________  °Date: ____________ Movements: ____________ Start time: ____________ Finish time: ____________ °Date: ____________ Movements: ____________ Start time: ____________ Finish time: ____________ °Date: ____________ Movements: ____________ Start time: ____________ Finish time: ____________ °Date: ____________ Movements: ____________ Start time: ____________ Finish time: ____________ °Date: ____________ Movements: ____________ Start time: ____________ Finish time: ____________ °Date: ____________ Movements: ____________ Start time: ____________ Finish time: ____________ °Date: ____________ Movements: ____________ Start time: ____________ Finish time: ____________  °Date: ____________ Movements: ____________ Start time: ____________ Finish time: ____________ °Date: ____________ Movements: ____________ Start time: ____________ Finish time: ____________ °Date: ____________ Movements: ____________ Start time: ____________ Finish time: ____________ °Date: ____________ Movements: ____________ Start time: ____________ Finish time: ____________ °Date: ____________ Movements: ____________ Start time: ____________ Finish time: ____________ °Date: ____________ Movements:  ____________ Start time: ____________ Finish time:   ____________ °Date: ____________ Movements: ____________ Start time: ____________ Finish time: ____________  °Date: ____________ Movements: ____________ Start time: ____________ Finish time: ____________ °Date: ____________ Movements: ____________ Start time: ____________ Finish time: ____________ °Date: ____________ Movements: ____________ Start time: ____________ Finish time: ____________ °Date: ____________ Movements: ____________ Start time: ____________ Finish time: ____________ °Date: ____________ Movements: ____________ Start time: ____________ Finish time: ____________ °Date: ____________ Movements: ____________ Start time: ____________ Finish time: ____________ °Date: ____________ Movements: ____________ Start time: ____________ Finish time: ____________  °Date: ____________ Movements: ____________ Start time: ____________ Finish time: ____________ °Date: ____________ Movements: ____________ Start time: ____________ Finish time: ____________ °Date: ____________ Movements: ____________ Start time: ____________ Finish time: ____________ °Date: ____________ Movements: ____________ Start time: ____________ Finish time: ____________ °Date: ____________ Movements: ____________ Start time: ____________ Finish time: ____________ °Date: ____________ Movements: ____________ Start time: ____________ Finish time: ____________ °Date: ____________ Movements: ____________ Start time: ____________ Finish time: ____________  °Date: ____________ Movements: ____________ Start time: ____________ Finish time: ____________ °Date: ____________ Movements: ____________ Start time: ____________ Finish time: ____________ °Date: ____________ Movements: ____________ Start time: ____________ Finish time: ____________ °Date: ____________ Movements: ____________ Start time: ____________ Finish time: ____________ °Date: ____________ Movements: ____________ Start time: ____________ Finish  time: ____________ °Date: ____________ Movements: ____________ Start time: ____________ Finish time: ____________ °Date: ____________ Movements: ____________ Start time: ____________ Finish time: ____________  °Date: ____________ Movements: ____________ Start time: ____________ Finish time: ____________ °Date: ____________ Movements: ____________ Start time: ____________ Finish time: ____________ °Date: ____________ Movements: ____________ Start time: ____________ Finish time: ____________ °Date: ____________ Movements: ____________ Start time: ____________ Finish time: ____________ °Date: ____________ Movements: ____________ Start time: ____________ Finish time: ____________ °Date: ____________ Movements: ____________ Start time: ____________ Finish time: ____________ °  °This information is not intended to replace advice given to you by your health care provider. Make sure you discuss any questions you have with your health care provider. °  °Document Released: 07/07/2006 Document Revised: 06/28/2014 Document Reviewed: 04/03/2012 °Elsevier Interactive Patient Education ©2016 Elsevier Inc. ° °

## 2016-02-09 ENCOUNTER — Encounter (HOSPITAL_COMMUNITY): Payer: Self-pay | Admitting: *Deleted

## 2016-02-09 ENCOUNTER — Inpatient Hospital Stay (HOSPITAL_COMMUNITY)
Admission: AD | Admit: 2016-02-09 | Discharge: 2016-02-09 | Disposition: A | Discharge status: Home / Self Care | Payer: Medicaid Other | Source: Ambulatory Visit | Attending: Obstetrics and Gynecology | Admitting: Obstetrics and Gynecology

## 2016-02-09 DIAGNOSIS — Z3483 Encounter for supervision of other normal pregnancy, third trimester: Secondary | ICD-10-CM | POA: Diagnosis not present

## 2016-02-09 DIAGNOSIS — Z3A40 40 weeks gestation of pregnancy: Secondary | ICD-10-CM | POA: Insufficient documentation

## 2016-02-09 NOTE — Discharge Instructions (Signed)
Keep your scheduled appt.for prenatal care. Return to MAU as needed.

## 2016-02-09 NOTE — Progress Notes (Signed)
Report given to D. Hart RochesterLawson, CNM; pt may walk for an hour and be recheck

## 2016-02-09 NOTE — MAU Note (Signed)
PT SAYS UC  HURT   STRONG  AT 0325.  WAS  HERE LAST WEEK  FOR  LABOR  EVAL-   2  CM.   SENT  HOME.

## 2016-02-13 ENCOUNTER — Inpatient Hospital Stay (HOSPITAL_COMMUNITY): Payer: Medicaid Other | Admitting: Anesthesiology

## 2016-02-13 ENCOUNTER — Inpatient Hospital Stay (HOSPITAL_COMMUNITY)
Admission: AD | Admit: 2016-02-13 | Discharge: 2016-02-15 | DRG: 767 | Disposition: A | Discharge status: Home / Self Care | Payer: Medicaid Other | Source: Ambulatory Visit | Attending: Obstetrics and Gynecology | Admitting: Obstetrics and Gynecology

## 2016-02-13 ENCOUNTER — Encounter (HOSPITAL_COMMUNITY): Payer: Self-pay

## 2016-02-13 DIAGNOSIS — O4202 Full-term premature rupture of membranes, onset of labor within 24 hours of rupture: Principal | ICD-10-CM | POA: Diagnosis present

## 2016-02-13 DIAGNOSIS — Z3A40 40 weeks gestation of pregnancy: Secondary | ICD-10-CM | POA: Diagnosis not present

## 2016-02-13 DIAGNOSIS — Z302 Encounter for sterilization: Secondary | ICD-10-CM

## 2016-02-13 DIAGNOSIS — Z87891 Personal history of nicotine dependence: Secondary | ICD-10-CM

## 2016-02-13 DIAGNOSIS — Z3490 Encounter for supervision of normal pregnancy, unspecified, unspecified trimester: Secondary | ICD-10-CM

## 2016-02-13 LAB — ABO/RH: ABO/RH(D): A POS

## 2016-02-13 LAB — CBC
HCT: 34 % — ABNORMAL LOW (ref 36.0–46.0)
HEMATOCRIT: 31.2 % — AB (ref 36.0–46.0)
HEMOGLOBIN: 11.7 g/dL — AB (ref 12.0–15.0)
Hemoglobin: 10.4 g/dL — ABNORMAL LOW (ref 12.0–15.0)
MCH: 26.5 pg (ref 26.0–34.0)
MCH: 25.8 pg — ABNORMAL LOW (ref 26.0–34.0)
MCHC: 34.4 g/dL (ref 30.0–36.0)
MCHC: 33.3 g/dL (ref 30.0–36.0)
MCV: 77.1 fL — ABNORMAL LOW (ref 78.0–100.0)
MCV: 77.4 fL — AB (ref 78.0–100.0)
PLATELETS: UNDETERMINED 10*3/uL (ref 150–400)
Platelets: 265 10*3/uL (ref 150–400)
RBC: 4.41 MIL/uL (ref 3.87–5.11)
RBC: 4.03 MIL/uL (ref 3.87–5.11)
RDW: 16.2 % — ABNORMAL HIGH (ref 11.5–15.5)
RDW: 16.1 % — AB (ref 11.5–15.5)
WBC: 11.5 10*3/uL — ABNORMAL HIGH (ref 4.0–10.5)
WBC: 9.3 10*3/uL (ref 4.0–10.5)

## 2016-02-13 LAB — TYPE AND SCREEN
ABO/RH(D): A POS
ANTIBODY SCREEN: NEGATIVE

## 2016-02-13 LAB — RPR: RPR: NONREACTIVE

## 2016-02-13 MED ORDER — EPHEDRINE 5 MG/ML INJ
10.0000 mg | INTRAVENOUS | Status: DC | PRN
  Filled 2016-02-13: qty 4

## 2016-02-13 MED ORDER — IBUPROFEN 600 MG PO TABS
600.0000 mg | ORAL_TABLET | Freq: Four times a day (QID) | ORAL | Status: DC
  Administered 2016-02-14 – 2016-02-15 (×4): 600 mg via ORAL
  Filled 2016-02-13 (×5): qty 1

## 2016-02-13 MED ORDER — LACTATED RINGERS IV SOLN
INTRAVENOUS | Status: DC
  Administered 2016-02-13 (×2): via INTRAVENOUS

## 2016-02-13 MED ORDER — PHENYLEPHRINE 40 MCG/ML (10ML) SYRINGE FOR IV PUSH (FOR BLOOD PRESSURE SUPPORT)
80.0000 ug | PREFILLED_SYRINGE | INTRAVENOUS | Status: DC | PRN
  Filled 2016-02-13: qty 5

## 2016-02-13 MED ORDER — OXYTOCIN 40 UNITS IN LACTATED RINGERS INFUSION - SIMPLE MED
1.0000 m[IU]/min | INTRAVENOUS | Status: DC
  Administered 2016-02-13: 2 m[IU]/min via INTRAVENOUS

## 2016-02-13 MED ORDER — SIMETHICONE 80 MG PO CHEW: 80.0000 mg | CHEWABLE_TABLET | ORAL | Status: DC | PRN

## 2016-02-13 MED ORDER — LACTATED RINGERS IV SOLN
500.0000 mL | Freq: Once | INTRAVENOUS | Status: AC
  Administered 2016-02-13: 06:00:00 via INTRAVENOUS

## 2016-02-13 MED ORDER — BENZOCAINE-MENTHOL 20-0.5 % EX AERO
1.0000 "application " | INHALATION_SPRAY | CUTANEOUS | Status: DC | PRN
  Filled 2016-02-13: qty 56

## 2016-02-13 MED ORDER — TERBUTALINE SULFATE 1 MG/ML IJ SOLN
0.2500 mg | Freq: Once | INTRAMUSCULAR | Status: DC | PRN
  Filled 2016-02-13: qty 1

## 2016-02-13 MED ORDER — OXYCODONE HCL 5 MG PO TABS
5.0000 mg | ORAL_TABLET | ORAL | Status: DC | PRN
  Administered 2016-02-14: 5 mg via ORAL
  Filled 2016-02-13: qty 1

## 2016-02-13 MED ORDER — LIDOCAINE HCL (PF) 1 % IJ SOLN
INTRAMUSCULAR | Status: DC | PRN
  Administered 2016-02-13: 4 mL
  Administered 2016-02-13: 4 mL via EPIDURAL

## 2016-02-13 MED ORDER — DIPHENHYDRAMINE HCL 25 MG PO CAPS: 25.0000 mg | ORAL_CAPSULE | Freq: Four times a day (QID) | ORAL | Status: DC | PRN

## 2016-02-13 MED ORDER — PRENATAL MULTIVITAMIN CH: 1.0000 | ORAL_TABLET | Freq: Every day | ORAL | Status: DC

## 2016-02-13 MED ORDER — FENTANYL 2.5 MCG/ML BUPIVACAINE 1/10 % EPIDURAL INFUSION (WH - ANES)
INTRAMUSCULAR | Status: AC
  Filled 2016-02-13: qty 125

## 2016-02-13 MED ORDER — DIBUCAINE 1 % RE OINT: 1.0000 "application " | TOPICAL_OINTMENT | RECTAL | Status: DC | PRN

## 2016-02-13 MED ORDER — ACETAMINOPHEN 325 MG PO TABS: 650.0000 mg | ORAL_TABLET | ORAL | Status: DC | PRN

## 2016-02-13 MED ORDER — ONDANSETRON HCL 4 MG PO TABS: 4.0000 mg | ORAL_TABLET | ORAL | Status: DC | PRN

## 2016-02-13 MED ORDER — WITCH HAZEL-GLYCERIN EX PADS: 1.0000 "application " | MEDICATED_PAD | CUTANEOUS | Status: DC | PRN

## 2016-02-13 MED ORDER — LACTATED RINGERS IV SOLN: 500.0000 mL | INTRAVENOUS | Status: DC | PRN

## 2016-02-13 MED ORDER — ACETAMINOPHEN 325 MG PO TABS
650.0000 mg | ORAL_TABLET | ORAL | Status: DC | PRN
Start: 2016-02-13 — End: 2016-02-15
  Administered 2016-02-14: 650 mg via ORAL
  Filled 2016-02-13 (×2): qty 2

## 2016-02-13 MED ORDER — COCONUT OIL OIL: 1.0000 "application " | TOPICAL_OIL | Status: DC | PRN

## 2016-02-13 MED ORDER — OXYTOCIN 40 UNITS IN LACTATED RINGERS INFUSION - SIMPLE MED
2.5000 [IU]/h | INTRAVENOUS | Status: DC
  Filled 2016-02-13: qty 1000

## 2016-02-13 MED ORDER — OXYTOCIN BOLUS FROM INFUSION
500.0000 mL | Freq: Once | INTRAVENOUS | Status: AC
  Administered 2016-02-13: 500 mL via INTRAVENOUS

## 2016-02-13 MED ORDER — BUPIVACAINE HCL (PF) 0.25 % IJ SOLN
INTRAMUSCULAR | Status: DC | PRN
  Administered 2016-02-13: 8 mL via EPIDURAL

## 2016-02-13 MED ORDER — ONDANSETRON HCL 4 MG/2ML IJ SOLN: 4.0000 mg | INTRAMUSCULAR | Status: DC | PRN

## 2016-02-13 MED ORDER — OXYCODONE-ACETAMINOPHEN 5-325 MG PO TABS
2.0000 | ORAL_TABLET | ORAL | Status: DC | PRN
Start: 2016-02-13 — End: 2016-02-13

## 2016-02-13 MED ORDER — ONDANSETRON HCL 4 MG/2ML IJ SOLN: 4.0000 mg | Freq: Four times a day (QID) | INTRAMUSCULAR | Status: DC | PRN

## 2016-02-13 MED ORDER — LIDOCAINE HCL (PF) 1 % IJ SOLN
30.0000 mL | INTRAMUSCULAR | Status: DC | PRN
  Filled 2016-02-13: qty 30

## 2016-02-13 MED ORDER — FENTANYL 2.5 MCG/ML BUPIVACAINE 1/10 % EPIDURAL INFUSION (WH - ANES)
14.0000 mL/h | INTRAMUSCULAR | Status: DC | PRN
  Administered 2016-02-13 (×2): 14 mL/h via EPIDURAL
  Filled 2016-02-13: qty 125

## 2016-02-13 MED ORDER — LACTATED RINGERS IV SOLN: 500.0000 mL | Freq: Once | INTRAVENOUS | Status: DC

## 2016-02-13 MED ORDER — DIPHENHYDRAMINE HCL 50 MG/ML IJ SOLN: 12.5000 mg | INTRAMUSCULAR | Status: DC | PRN

## 2016-02-13 MED ORDER — OXYCODONE-ACETAMINOPHEN 5-325 MG PO TABS
1.0000 | ORAL_TABLET | ORAL | Status: DC | PRN
Start: 2016-02-13 — End: 2016-02-13

## 2016-02-13 MED ORDER — TETANUS-DIPHTH-ACELL PERTUSSIS 5-2.5-18.5 LF-MCG/0.5 IM SUSP: 0.5000 mL | Freq: Once | INTRAMUSCULAR | Status: DC

## 2016-02-13 MED ORDER — ZOLPIDEM TARTRATE 5 MG PO TABS: 5.0000 mg | ORAL_TABLET | Freq: Every evening | ORAL | Status: DC | PRN

## 2016-02-13 MED ORDER — SENNOSIDES-DOCUSATE SODIUM 8.6-50 MG PO TABS
2.0000 | ORAL_TABLET | ORAL | Status: DC
  Administered 2016-02-14 (×2): 2 via ORAL
  Filled 2016-02-13 (×2): qty 2

## 2016-02-13 MED ORDER — SOD CITRATE-CITRIC ACID 500-334 MG/5ML PO SOLN
30.0000 mL | ORAL | Status: DC | PRN
  Filled 2016-02-13: qty 15

## 2016-02-13 MED ORDER — PHENYLEPHRINE 40 MCG/ML (10ML) SYRINGE FOR IV PUSH (FOR BLOOD PRESSURE SUPPORT)
PREFILLED_SYRINGE | INTRAVENOUS | Status: AC
  Filled 2016-02-13: qty 10

## 2016-02-13 MED ORDER — OXYCODONE HCL 5 MG PO TABS
10.0000 mg | ORAL_TABLET | ORAL | Status: DC | PRN
  Administered 2016-02-13: 10 mg via ORAL
  Filled 2016-02-13: qty 2

## 2016-02-13 NOTE — Progress Notes (Signed)
[name] is a 30 y.o. G3P1011 at 5536w2d by LMP admitted for active labor  Subjective: Pt resting comfortable with epidural  Objective: BP 131/80   Pulse 73   Temp 98.1 F (36.7 C) (Oral)   Resp 20   Ht 5\' 4"  (1.626 m)   Wt 93 kg (205 lb)   LMP 05/05/2015   SpO2 99%   BMI 35.19 kg/m  No intake/output data recorded. No intake/output data recorded.  FHT:  FHR: 125 bpm, variability: moderate,  accelerations:  Present,  decelerations:  Absent UC:   regular, every 2-4 minutes SVE:   7/90/-2  Labs: Lab Results  Component Value Date   WBC 9.3 02/13/2016   HGB 10.4 (L) 02/13/2016   HCT 31.2 (L) 02/13/2016   MCV 77.4 (L) 02/13/2016   PLT 265 02/13/2016    Assessment / Plan: Spontaneous labor, progressing normally Difficulty tracing FHT, FSE applied without difficulty Labor: Progressing normally Fetal Wellbeing:  Category II Pain Control:  Epidural I/D:  n/a Anticipated MOD:  NSVD  Stacie Diette 02/13/2016, 11:08 AM  OB FELLOW LABOR PROGRESS NOTE ATTESTATION  I have seen and examined this patient and agree with above documentation in the resident's note.   Ernestina PennaNicholas Milca Sytsma, MD 11:43 AM

## 2016-02-13 NOTE — Consult Note (Signed)
[facility]  Delivery Note:  SVD      02/13/2016  6:08 PM  I was called to the delivery room at the request of the patient's obstetrician (Dr. Harraway-Smith) for delivery by vacuum assistance.  PRENATAL HX:  This is a 29 y/o G3P1011 at 40 and 2/[redacted] weeks gestation who was admitted this morning for SROM ~ 2am (clear fluid, ROM x16 hours).  She is GBS negative.  Her pregnancy has been uncomplicated.  NICU called to delivery for vacuum assistance.  There were also noted to be frequent decelerations at the time of delivery.    DELIVERY:  Infant was apneic with poor tone at delivery and did not respond to routine warming, drying and stimulation.  PPV administered for 30 seconds with immediate improvement in HR.  Infant began to breathe spontaneously and HR remained > 100.  Blow by O2 given between 4 and 5 minutes to maintain goal saturations, then O2 saturations remained in low 90s.  APGARs 1 and 8.  Exam notable for molding and breakdown of skin along the right frontoparietal area, the site of vacuum application.  Otherwise his exam was within normal limits.  After 10 minutes, baby left with nurse to assist parents with skin-to-skin care.   _____________________ Electronically Signed By: Suliman Termini, MD Neonatologist  

## 2016-02-13 NOTE — H&P (Signed)
LABOR ADMISSION HISTORY AND PHYSICAL  [name] is a 30 y.o. female G3P1011 with IUP at [redacted]w[redacted]d by LMP  presenting for PROM @ 2 AM. Positive fern.  She reports +FM, + contractions, No LOF, no VB, no blurry vision, headaches or peripheral edema, and RUQ pain.  She plans on breast feeding. She request BTL for birth control.  Dating: By LMP --->  Estimated Date of Delivery: 02/11/16  Sono:    @[redacted]w[redacted]d , CWD, EIF noted on anatomy scan, cephalic    Prenatal History/Complications: None   Past Medical History: Past Medical History:  Diagnosis Date  . Injury of right rotator cuff   . Kidney infection in 20's    Past Surgical History: Past Surgical History:  Procedure Laterality Date  . NO PAST SURGERIES      Obstetrical History: OB History    Gravida Para Term Preterm AB Living   3 1 1  0 1 1   SAB TAB Ectopic Multiple Live Births   0 1 0 0 1      Social History: Social History   Social History  . Marital status: Single    Spouse name: N/A  . Number of children: N/A  . Years of education: N/A   Social History Main Topics  . Smoking status: Former Smoker    Packs/day: 0.25    Types: Cigarettes    Quit date: 05/06/2015  . Smokeless tobacco: Never Used  . Alcohol use No     Comment: social  . Drug use: No  . Sexual activity: No     Comment: pt plans to get tubes tied   Other Topics Concern  . Not on file   Social History Narrative  . No narrative on file    Family History: Family History  Problem Relation Age of Onset  . Cancer Other     Allergies: Allergies  Allergen Reactions  . Latex Other (See Comments)    Causes dry skin.    Prescriptions Prior to Admission  Medication Sig Dispense Refill Last Dose  . fluticasone (CUTIVATE) 0.05 % cream Apply topically 2 (two) times daily. (Patient not taking: Reported on 10/26/2015) 30 g 0 Not Taking at Unknown time  . ondansetron (ZOFRAN ODT) 8 MG disintegrating tablet Take 1 tablet (8 mg total) by mouth every  8 (eight) hours as needed for nausea or vomiting. (Patient not taking: Reported on 10/26/2015) 12 tablet 0 Not Taking at Unknown time  . Prenatal Vit-Fe Fumarate-FA (PRENATAL MULTIVITAMIN) TABS tablet Take 1 tablet by mouth daily at 12 noon.    02/08/2016 at Unknown time     Review of Systems   All systems reviewed and negative except as stated in HPI  LMP 05/05/2015  General appearance: alert and cooperative Abdomen: soft, non-tender; bowel sounds normal Extremities: Homans sign is negative, no sign of DVT, edema Presentation: breech Fetal monitoringBaseline: 130 bpm, Variability: Good {> 6 bpm), Accelerations: Reactive and Decelerations: Absent  Uterine activity: Irregular contractions      Prenatal labs: ABO, Rh:  A positive  Antibody:  Negative  Rubella: Immune RPR: Nonreactive (02/16 0000)  HBsAg: Negative (02/16 0000)  HIV: Non-reactive (02/16 0000)  GBS: Negative (07/25 0000)  1 hr Glucola 132 Genetic screening Not obtained  Anatomy US  EIF   Prenatal Transfer Tool  Maternal Diabetes: No Genetic Screening: Normal Maternal Ultrasounds/Referrals: Normal Fetal Ultrasounds or other Referrals:  None Maternal Substance Abuse:  No Significant Maternal Medications:  None Significant Maternal Lab Results: None  No results found for this or any previous visit (from the past 24 hour(s)).  Patient Active Problem List   Diagnosis Date Noted  . 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 PAP SMEAR, ABNORMAL 07/04/2006    Assessment: [name] is a 30 y.o. G3P1011 at 5321w2d here for PROM with positive fern test   #Labor:Expectant Management, consider induction late this morning if no cervical change  #Pain: Epidural possibly   #FWB: Category 1  #ID:  GBS negative  #MOF: Breast  #MOC: BTL papers signed  #Circ:  Yes[facility]  [name], [name]   I have seen and agree with the above [name],[name]'

## 2016-02-13 NOTE — Progress Notes (Signed)
Patient ID: [name], female   DOB: [birth year], 30 y.o.   MRN: [id number]  Bedside ultrasound: confirmed vertex down  Denise HerElsia J Nelson Julson, DO PGY-1 02/13/2016 9:57 AM

## 2016-02-13 NOTE — Progress Notes (Signed)
NICU team at bedside

## 2016-02-13 NOTE — Anesthesia Procedure Notes (Signed)
Epidural Patient location during procedure: OB Start time: 02/13/2016 7:00 AM End time: 02/13/2016 7:07 AM  Staffing Anesthesiologist: Shona SimpsonHOLLIS, Jerremy Maione D Performed: anesthesiologist   Preanesthetic Checklist Completed: patient identified, site marked, surgical consent, pre-op evaluation, timeout performed, IV checked, risks and benefits discussed and monitors and equipment checked  Epidural Patient position: sitting Prep: ChloraPrep Patient monitoring: heart rate, continuous pulse ox and blood pressure Approach: midline Location: L3-L4 Injection technique: LOR saline  Needle:  Needle type: Tuohy  Needle gauge: 17 G Needle length: 9 cm Catheter type: closed end flexible Catheter size: 20 Guage Test dose: negative and 1.5% lidocaine  Assessment Events: blood not aspirated, injection not painful, no injection resistance and no paresthesia  Additional Notes LOR @ 6  Patient identified. Risks/Benefits/Options discussed with patient including but not limited to bleeding, infection, nerve damage, paralysis, failed block, incomplete pain control, headache, blood pressure changes, nausea, vomiting, reactions to medications, itching and postpartum back pain. Confirmed with bedside nurse the patient's most recent platelet count. Confirmed with patient that they are not currently taking any anticoagulation, have any bleeding history or any family history of bleeding disorders. Patient expressed understanding and wished to proceed. All questions were answered. Sterile technique was used throughout the entire procedure. Please see nursing notes for vital signs. Test dose was given through epidural catheter and negative prior to continuing to dose epidural or start infusion. Warning signs of high block given to the patient including shortness of breath, tingling/numbness in hands, complete motor block, or any concerning symptoms with instructions to call for help. Patient was given instructions on fall  risk and not to get out of bed. All questions and concerns addressed with instructions to call with any issues or inadequate analgesia.    Reason for block:procedure for pain

## 2016-02-13 NOTE — Progress Notes (Signed)
Pt requesting Epidural. Waiting for CBC to result. Anesthesia in department, RN notified of pt epidural request

## 2016-02-13 NOTE — Anesthesia Preprocedure Evaluation (Addendum)
Anesthesia Evaluation  Patient identified by MRN, date of birth, ID band Patient awake    Reviewed: Allergy & Precautions, Patient's Chart, lab work & pertinent test results  Airway Mallampati: III       Dental  (+) Teeth Intact   Pulmonary former smoker,    breath sounds clear to auscultation       Cardiovascular negative cardio ROS   Rhythm:Regular Rate:Normal     Neuro/Psych negative neurological ROS  negative psych ROS   GI/Hepatic negative GI ROS, Neg liver ROS,   Endo/Other  negative endocrine ROS  Renal/GU negative Renal ROS  negative genitourinary   Musculoskeletal negative musculoskeletal ROS (+)   Abdominal   Peds negative pediatric ROS (+)  Hematology negative hematology ROS (+)   Anesthesia Other Findings   Reproductive/Obstetrics (+) Pregnancy                                Anesthesia Physical Anesthesia Plan  ASA: II  Anesthesia Plan: Epidural   Post-op Pain Management:    Induction:   Airway Management Planned:   Additional Equipment:   Intra-op Plan: Utilization of Controlled Hypotension per surrgeon request  Post-operative Plan:   Informed Consent: I have reviewed the patients History and Physical, chart, labs and discussed the procedure including the risks, benefits and alternatives for the proposed anesthesia with the patient or authorized representative who has indicated his/her understanding and acceptance.     Plan Discussed with:   Anesthesia Plan Comments: (Non - latex gloves used. )       Anesthesia Quick Evaluation

## 2016-02-13 NOTE — Progress Notes (Signed)
Patient ID: [name], female   DOB: 1985-11-20, 30 y.o.   MRN: [id number] Pt having increased pressure, and pain with contractions SVE: 10/100/0 Fetal monitoring: 125/mod variability/+accel/occ variables Plan: labor down till constant urge to push  OB FELLOW INTRAPARTUM PROGRESS NOTE ATTESTATION  I have seen and examined this patient and agree with above documentation in the Midwife students note.   Ernestina PennaNicholas Nikalas Bramel, MD 3:16 PM

## 2016-02-13 NOTE — Anesthesia Pain Management Evaluation Note (Signed)
  [facility] Pain Management Visit Note  Patient: [name], 30 y.o., female  "[facility] We have an anesthesia team available at all times to provide care throughout the hospital, including epidural management and anesthesia for C-section. I don't know your plan for the delivery whether it a natural birth, water birth, IV sedation, nitrous supplementation, doula or epidural, but we want to meet your pain goals."   1.Was your pain managed to your expectations on prior hospitalizations?   Unable to assess - patient sleeping  2.What is your expectation for pain management during this hospitalization?     Epidural  3.How can we help you reach that goal? unsure  Record the patient's initial score and the patient's pain goal.   Pain: Patient sleeping - unable to assess  Pain Goal: 4 [facility] to be able to say your pain was always managed very well.  Cephus ShellingBURGER,Gyasi Hazzard 02/13/2016

## 2016-02-14 ENCOUNTER — Encounter (HOSPITAL_COMMUNITY)
Admission: AD | Disposition: A | Discharge status: Home / Self Care | Payer: Self-pay | Source: Ambulatory Visit | Attending: Obstetrics and Gynecology

## 2016-02-14 ENCOUNTER — Inpatient Hospital Stay (HOSPITAL_COMMUNITY): Payer: Medicaid Other | Admitting: Anesthesiology

## 2016-02-14 ENCOUNTER — Encounter (HOSPITAL_COMMUNITY): Payer: Self-pay | Admitting: Anesthesiology

## 2016-02-14 DIAGNOSIS — Z302 Encounter for sterilization: Secondary | ICD-10-CM

## 2016-02-14 HISTORY — PX: TUBAL LIGATION: SHX77

## 2016-02-14 LAB — CBC
HEMATOCRIT: 30.9 % — AB (ref 36.0–46.0)
Hemoglobin: 10.3 g/dL — ABNORMAL LOW (ref 12.0–15.0)
MCH: 25.7 pg — ABNORMAL LOW (ref 26.0–34.0)
MCHC: 33.3 g/dL (ref 30.0–36.0)
MCV: 77.1 fL — ABNORMAL LOW (ref 78.0–100.0)
PLATELETS: 164 10*3/uL (ref 150–400)
RBC: 4.01 MIL/uL (ref 3.87–5.11)
RDW: 16.3 % — AB (ref 11.5–15.5)
WBC: 20.6 10*3/uL — AB (ref 4.0–10.5)

## 2016-02-14 SURGERY — LIGATION, FALLOPIAN TUBE, POSTPARTUM
Anesthesia: Epidural | Laterality: Bilateral

## 2016-02-14 MED ORDER — ONDANSETRON HCL 4 MG/2ML IJ SOLN
INTRAMUSCULAR | Status: AC
  Filled 2016-02-14: qty 2

## 2016-02-14 MED ORDER — FENTANYL CITRATE (PF) 100 MCG/2ML IJ SOLN: 25.0000 ug | INTRAMUSCULAR | Status: DC | PRN

## 2016-02-14 MED ORDER — FENTANYL CITRATE (PF) 100 MCG/2ML IJ SOLN
INTRAMUSCULAR | Status: DC | PRN
  Administered 2016-02-14 (×2): 50 ug via INTRAVENOUS

## 2016-02-14 MED ORDER — FENTANYL CITRATE (PF) 100 MCG/2ML IJ SOLN
INTRAMUSCULAR | Status: AC
  Filled 2016-02-14: qty 2

## 2016-02-14 MED ORDER — SODIUM BICARBONATE 8.4 % IV SOLN
INTRAVENOUS | Status: DC | PRN
  Administered 2016-02-14: 7 mL via EPIDURAL
  Administered 2016-02-14: 3 mL via EPIDURAL
  Administered 2016-02-14 (×2): 5 mL via EPIDURAL

## 2016-02-14 MED ORDER — METOCLOPRAMIDE HCL 10 MG PO TABS
10.0000 mg | ORAL_TABLET | Freq: Once | ORAL | Status: AC
  Administered 2016-02-14: 10 mg via ORAL
  Filled 2016-02-14: qty 1

## 2016-02-14 MED ORDER — SODIUM BICARBONATE 8.4 % IV SOLN
INTRAVENOUS | Status: AC
  Filled 2016-02-14: qty 50

## 2016-02-14 MED ORDER — LIDOCAINE-EPINEPHRINE (PF) 2 %-1:200000 IJ SOLN
INTRAMUSCULAR | Status: AC
  Filled 2016-02-14: qty 20

## 2016-02-14 MED ORDER — METOCLOPRAMIDE HCL 5 MG/ML IJ SOLN: 10.0000 mg | Freq: Once | INTRAMUSCULAR | Status: DC | PRN

## 2016-02-14 MED ORDER — LACTATED RINGERS IV SOLN: INTRAVENOUS | Status: DC

## 2016-02-14 MED ORDER — ONDANSETRON HCL 4 MG/2ML IJ SOLN
INTRAMUSCULAR | Status: DC | PRN
  Administered 2016-02-14: 4 mg via INTRAVENOUS

## 2016-02-14 MED ORDER — MIDAZOLAM HCL 2 MG/2ML IJ SOLN
INTRAMUSCULAR | Status: DC | PRN
  Administered 2016-02-14 (×2): 1 mg via INTRAVENOUS

## 2016-02-14 MED ORDER — BUPIVACAINE HCL (PF) 0.25 % IJ SOLN
INTRAMUSCULAR | Status: DC | PRN
  Administered 2016-02-14: 20 mL

## 2016-02-14 MED ORDER — MIDAZOLAM HCL 2 MG/2ML IJ SOLN
INTRAMUSCULAR | Status: AC
  Filled 2016-02-14: qty 2

## 2016-02-14 MED ORDER — LACTATED RINGERS IV SOLN
INTRAVENOUS | Status: DC | PRN
  Administered 2016-02-14: 10:00:00 via INTRAVENOUS

## 2016-02-14 MED ORDER — BUPIVACAINE HCL (PF) 0.25 % IJ SOLN
INTRAMUSCULAR | Status: AC
  Filled 2016-02-14: qty 30

## 2016-02-14 MED ORDER — FAMOTIDINE 20 MG PO TABS
40.0000 mg | ORAL_TABLET | Freq: Once | ORAL | Status: AC
  Administered 2016-02-14: 40 mg via ORAL
  Filled 2016-02-14: qty 2

## 2016-02-14 MED ORDER — LACTATED RINGERS IV SOLN
INTRAVENOUS | Status: DC
  Administered 2016-02-14: 08:00:00 via INTRAVENOUS

## 2016-02-14 SURGICAL SUPPLY — 22 items
BLADE SURG 11 STRL SS (BLADE) ×3 IMPLANT
CLIP FILSHIE TUBAL LIGA STRL (Clip) ×5 IMPLANT
CLOTH BEACON ORANGE TIMEOUT ST (SAFETY) ×3 IMPLANT
DRSG OPSITE POSTOP 3X4 (GAUZE/BANDAGES/DRESSINGS) ×3 IMPLANT
DURAPREP 26ML APPLICATOR (WOUND CARE) ×3 IMPLANT
GLOVE BIO SURGEON STRL SZ 6.5 (GLOVE) ×2 IMPLANT
GLOVE BIO SURGEONS STRL SZ 6.5 (GLOVE) ×1
GLOVE BIOGEL PI IND STRL 7.0 (GLOVE) ×2 IMPLANT
GLOVE BIOGEL PI INDICATOR 7.0 (GLOVE) ×4
GOWN STRL REUS W/TWL LRG LVL3 (GOWN DISPOSABLE) ×6 IMPLANT
NEEDLE HYPO 22GX1.5 SAFETY (NEEDLE) ×3 IMPLANT
NS IRRIG 1000ML POUR BTL (IV SOLUTION) ×3 IMPLANT
PACK ABDOMINAL MINOR (CUSTOM PROCEDURE TRAY) ×3 IMPLANT
PROTECTOR NERVE ULNAR (MISCELLANEOUS) ×3 IMPLANT
SPONGE LAP 4X18 X RAY DECT (DISPOSABLE) ×2 IMPLANT
SUT VIC AB 0 CT1 27 (SUTURE) ×3
SUT VIC AB 0 CT1 27XBRD ANBCTR (SUTURE) ×1 IMPLANT
SUT VICRYL 4-0 PS2 18IN ABS (SUTURE) ×3 IMPLANT
SYR CONTROL 10ML LL (SYRINGE) ×3 IMPLANT
TOWEL OR 17X24 6PK STRL BLUE (TOWEL DISPOSABLE) ×6 IMPLANT
TRAY FOLEY CATH SILVER 16FR (SET/KITS/TRAYS/PACK) ×1 IMPLANT
WATER STERILE IRR 1000ML POUR (IV SOLUTION) ×3 IMPLANT

## 2016-02-14 NOTE — Addendum Note (Signed)
Addendum  created 02/14/16 1252 by Angela Adamana G Jozalyn Baglio, CRNA   Charge Capture section accepted

## 2016-02-14 NOTE — Anesthesia Preprocedure Evaluation (Signed)
Anesthesia Evaluation  Patient identified by MRN, date of birth, ID band Patient awake    Reviewed: Allergy & Precautions, Patient's Chart, lab work & pertinent test results  Airway Mallampati: III       Dental  (+) Teeth Intact   Pulmonary former smoker,    breath sounds clear to auscultation       Cardiovascular negative cardio ROS   Rhythm:Regular Rate:Normal     Neuro/Psych negative neurological ROS  negative psych ROS   GI/Hepatic negative GI ROS, Neg liver ROS,   Endo/Other  negative endocrine ROS  Renal/GU negative Renal ROS  negative genitourinary   Musculoskeletal negative musculoskeletal ROS (+)   Abdominal   Peds negative pediatric ROS (+)  Hematology negative hematology ROS (+)   Anesthesia Other Findings   Reproductive/Obstetrics                                Anesthesia Physical  Anesthesia Plan  ASA: II  Anesthesia Plan: Epidural   Post-op Pain Management:    Induction:   Airway Management Planned:   Additional Equipment:   Intra-op Plan:   Post-operative Plan:   Informed Consent: I have reviewed the patients History and Physical, chart, labs and discussed the procedure including the risks, benefits and alternatives for the proposed anesthesia with the patient or authorized representative who has indicated his/her understanding and acceptance.     Plan Discussed with:   Anesthesia Plan Comments:         Anesthesia Quick Evaluation

## 2016-02-14 NOTE — Progress Notes (Signed)
Patient arrived on unit from L&D approximately 2035. Pt was I/O cath by L&D nurse and output 750 ml. Around 2145 I assessed patient and the uterus was boggy, 2/U, and to the left. She also began to complain of some discomfort.Patient taken to the bathroom but unable to void. Bladder scanned patient and it showed patient to have >999 ml of retained urine. Dr.Harraway notified and orders given to place a foley until 6 am. Foley catheter placed and 800 of urine output but foley came out due to patient moving during placement. At the time patient decided that she wanted to wait to see if she could void on her own before attempting placement of another foley catheter. At 0000 patient called out in pain and felt that she needed to void again, pt was unable to void and requested the foley be placed again. Foley was placed at 0015 and urine output was 1500 ml at 0029. Pt pain  0/10 after bladder emptied. Will continue to monitor patient.

## 2016-02-14 NOTE — Transfer of Care (Signed)
Immediate Anesthesia Transfer of Care Note  Patient: [name]  Procedure(s) Performed: Procedure(s): POST PARTUM TUBAL LIGATION (Bilateral)  Patient Location: PACU  Anesthesia Type:Epidural  Level of Consciousness: awake and alert   Airway & Oxygen Therapy: Patient Spontanous Breathing  Post-op Assessment: Report given to RN and Post -op Vital signs reviewed and stable  Post vital signs: Reviewed and stable  Last Vitals:  Vitals:   02/14/16 0029 02/14/16 0747  BP: 134/68 112/64  Pulse: 92 78  Resp: 18 18  Temp: 36.9 C 36.4 C    Last Pain:  Vitals:   02/14/16 0747  TempSrc: Oral  PainSc:          Complications: No apparent anesthesia complications

## 2016-02-14 NOTE — Anesthesia Postprocedure Evaluation (Signed)
Anesthesia Post Note  Patient: [name]  Procedure(s) Performed: * No procedures listed *  Patient location during evaluation: Mother Baby Anesthesia Type: Epidural Level of consciousness: awake Pain management: pain level controlled Vital Signs Assessment: post-procedure vital signs reviewed and stable Respiratory status: spontaneous breathing Cardiovascular status: stable Postop Assessment: no headache, no backache, epidural receding, patient able to bend at knees, no signs of nausea or vomiting and adequate PO intake Anesthetic complications: no     Last Vitals:  Vitals:   02/14/16 1245 02/14/16 1310  BP: 109/72 (!) 113/55  Pulse: 91 77  Resp: 17 18  Temp:  37.2 C    Last Pain:  Vitals:   02/14/16 1245  TempSrc:   PainSc: 0-No pain   Pain Goal: Patients Stated Pain Goal: 4 (02/14/16 1120)               [name]

## 2016-02-14 NOTE — Lactation Note (Signed)
This note was copied from a baby's chart. Lactation Consultation Note; Initial visit. Baby now 2919 hours old. Experienced BF mom. Mom getting ready to latch baby as I went into room. Reports baby has been latching pretty well but has been on nipple some. Reviewed wide open mouth and keeping the baby close to the breast throughout the feeding. Baby needed some stimulation to continue nursing. Reviewed normal behavior the first 24 hours and cluster feeding the second night. Encouraged to watch for feeding cues and feed whenever she sees them. No questions at present, Bf brochure given. Reviewed our phone number, OP appointments and BFSG as resources after DC. To call for assist prn  Patient Name: Denise Bettey CostaDonieta Ponce VHQIO'NToday's Date: 02/14/2016 Reason for consult: Initial assessment   Maternal Data Formula Feeding for Exclusion: No Has patient been taught Hand Expression?: Yes Does the patient have breastfeeding experience prior to this delivery?: Yes  Feeding Feeding Type: Breast Fed  LATCH Score/Interventions Latch: Grasps breast easily, tongue down, lips flanged, rhythmical sucking.  Audible Swallowing: A few with stimulation  Type of Nipple: Everted at rest and after stimulation  Comfort (Breast/Nipple): Soft / non-tender     Hold (Positioning): Assistance needed to correctly position infant at breast and maintain latch. Intervention(s): Breastfeeding basics reviewed;Support Pillows;Skin to skin  LATCH Score: 8  Lactation Tools Discussed/Used     Consult Status Consult Status: Follow-up Date: 02/15/16 Follow-up type: In-patient    Pamelia HoitWeeks, Yuniel Blaney D 02/14/2016, 2:15 PM

## 2016-02-14 NOTE — Anesthesia Postprocedure Evaluation (Signed)
Anesthesia Post Note  Patient: [name]  Procedure(s) Performed: * No procedures listed *  Patient location during evaluation: Mother Baby Anesthesia Type: Epidural Level of consciousness: awake Pain management: satisfactory to patient Vital Signs Assessment: post-procedure vital signs reviewed and stable Respiratory status: spontaneous breathing Cardiovascular status: stable Anesthetic complications: no     Last Vitals:  Vitals:   02/14/16 1245 02/14/16 1310  BP: 109/72 (!) 113/55  Pulse: 91 77  Resp: 17 18  Temp:  37.2 C    Last Pain:  Vitals:   02/14/16 1245  TempSrc:   PainSc: 0-No pain   Pain Goal: Patients Stated Pain Goal: 4 (02/14/16 1120)               Cephus ShellingBURGER,Deanne Bedgood

## 2016-02-14 NOTE — Op Note (Signed)
[name] 02/13/2016 - 02/14/2016  PREOPERATIVE DIAGNOSIS:  Multiparity, undesired fertility  POSTOPERATIVE DIAGNOSIS:  Multiparity, undesired fertility  PROCEDURE:  Postpartum Bilateral Tubal Sterilization using Filshie Clips   ANESTHESIA:  Epidural  COMPLICATIONS:  None immediate.  ESTIMATED BLOOD LOSS:  Less than 20 ml.   INDICATIONS: 30 y.o. [id number]  with undesired fertility,status post vaginal delivery, desires permanent sterilization. Risks and benefits of procedure discussed with patient including permanence of method, bleeding, infection, injury to surrounding organs and need for additional procedures. Risk failure of 0.5-1% with increased risk of ectopic gestation if pregnancy occurs was also discussed with patient.   FINDINGS:  Normal uterus, tubes, and ovaries.  TECHNIQUE:  The patient was taken to the operating room where her epidural anesthesia was dosed up to surgical level and found to be adequate.  She was then placed in the dorsal supine position and prepped and draped in sterile fashion.  After an adequate timeout was performed, attention was turned to the patient's abdomen where a small transverse skin incision was made under the umbilical fold. The incision was taken down to the layer of fascia using the scalpel, and fascia was incised, and extended bilaterally using Mayo scissors. The peritoneum was entered bluntly. Attention was then turned to the patient's uterus, and left fallopian tube was identified and followed out to the fimbriated end.  A Filshie clip was placed on the left fallopian tube about 2 cm from the cornual attachment, with care given to incorporate the underlying mesosalpinx.  A similar process was carried out on the rightl side allowing for bilateral tubal sterilization.  Good hemostasis was noted overall.  Local analgesia was drizzled on both operative sites.The instruments were then removed from the patient's abdomen and the fascial incision was  repaired with 0 Vicryl, and the skin was closed with a 3-0 Monocryl subcuticular stitch. The patient tolerated the procedure well.  Sponge, lap, and needle counts were correct times two.  The patient was then taken to the recovery room awake, extubated and in stable condition.

## 2016-02-14 NOTE — Progress Notes (Signed)
Teaching service MD notified of patient's inability to void - see orders. In and out cath x1 then leave in foley if patient needs to be catheterized the second time.

## 2016-02-14 NOTE — Progress Notes (Signed)
POSTPARTUM PROGRESS NOTE  Post Partum Day 1 Subjective:  [name] is a 30 y.o. [id number] 5270w2d s/p SVD with vacuum assistance.  No acute events overnight.  Pt denies problems with ambulating or po intake.  Patient currently with foley catheter in place 2/2 urinary retention. She denies nausea or vomiting.  Pain is well controlled.  She has had flatus. She has not had bowel movement.  Lochia Small.   Objective: Blood pressure 134/68, pulse 92, temperature 98.4 F (36.9 C), resp. rate 18, height 5\' 4"  (1.626 m), weight 93 kg (205 lb), last menstrual period 05/05/2015, SpO2 100 %, unknown if currently breastfeeding.  Physical Exam:  General: alert, cooperative and no distress Lochia:normal flow Chest: no respiratory distress Heart:regular rate, distal pulses intact Abdomen: soft, nontender,  Uterine Fundus: firm, appropriately tender DVT Evaluation: No calf swelling or tenderness Extremities: No edema   Recent Labs  02/13/16 0625 02/14/16 0525  HGB 10.4* 10.3*  HCT 31.2* 30.9*    Assessment/Plan:  ASSESSMENT: [name] is a 30 y.o. [id number] 1370w2d s/p SVD with vacuum assistance. Patient has been NPO since MN for planned BTL today.   Contraception BTL today  Breastfeeding    LOS: 1 day   Theora MasterCatherine L WallaceMD 02/14/2016, 7:47 AM

## 2016-02-14 NOTE — Anesthesia Postprocedure Evaluation (Signed)
Anesthesia Post Note  Patient: [name]  Procedure(s) Performed: Procedure(s) (LRB): POST PARTUM TUBAL LIGATION (Bilateral)  Patient location during evaluation: PACU Anesthesia Type: Epidural Level of consciousness: awake and alert Pain management: pain level controlled Vital Signs Assessment: post-procedure vital signs reviewed and stable Respiratory status: spontaneous breathing, nonlabored ventilation and respiratory function stable Cardiovascular status: blood pressure returned to baseline and stable Postop Assessment: no signs of nausea or vomiting Anesthetic complications: no     Last Vitals:  Vitals:   02/14/16 1130 02/14/16 1145  BP: 108/67   Pulse: 91 90  Resp: 14 14  Temp:      Last Pain:  Vitals:   02/14/16 1145  TempSrc:   PainSc: Asleep   Pain Goal: Patients Stated Pain Goal: 4 (02/14/16 1120)               Phillips Groutarignan, Dawnette Mione

## 2016-02-15 MED ORDER — IBUPROFEN 600 MG PO TABS: 600.0000 mg | ORAL_TABLET | Freq: Four times a day (QID) | ORAL | 0 refills | Status: DC

## 2016-02-15 MED ORDER — OXYCODONE HCL 10 MG PO TABS: 10.0000 mg | ORAL_TABLET | ORAL | 0 refills | Status: DC | PRN

## 2016-02-15 NOTE — Lactation Note (Signed)
This note was copied from a baby's chart. Lactation Consultation Note  Patient Name: Denise Bettey CostaDonieta Selk ZOXWR'UToday's Date: 02/15/2016  Mom is anxious to be discharged.  She states breastfeeding is going well and she just finished a feeding.  Baby is in crib sucking on a pacifier.  I suggested she put baby back to breast which would also allow me to do a latch score prior to discharge.  Mom states she wants to take a shower first and will call after.   Maternal Data    Feeding    LATCH Score/Interventions                      Lactation Tools Discussed/Used     Consult Status      Huston FoleyMOULDEN, Irbin Fines S 02/15/2016, 12:32 PM

## 2016-02-17 ENCOUNTER — Encounter (HOSPITAL_COMMUNITY): Payer: Self-pay | Admitting: Obstetrics & Gynecology

## 2016-02-21 NOTE — Discharge Summary (Signed)
OB Discharge Summary  Patient Name: [name] DOB: 02-Jun-1986 MRN: [id number]  Date of admission: 02/13/2016 Delivering MD: Willodean Rosenthal   Date of discharge: 02/21/2016  Admitting diagnosis: 40 WEEKS ROM desires sterilization Intrauterine pregnancy: [redacted]w[redacted]d     Secondary diagnosis:Active Problems:   Pregnancy  Additional problems:none     Discharge diagnosis: Term Pregnancy Delivered and BTL                                                                     Post partum procedures:none  Augmentation: n/a  Complications: None  Hospital course:  Onset of Labor With Vaginal Delivery     30 y.o. yo [id number] at [redacted]w[redacted]d was admitted in Active Labor on 02/13/2016. Patient had an uncomplicated labor course as follows:  Membrane Rupture Time/Date: 2:10 AM ,02/13/2016   Intrapartum Procedures: Episiotomy: None [1]                                         Lacerations:  None [1]  Patient had a delivery of a Viable infant. 02/13/2016  Information for the patient's newborn:  [name] [[id number]]       Pateint had an uncomplicated postpartum course.  She is ambulating, tolerating a regular diet, passing flatus, and urinating well. Patient is discharged home in stable condition on 02/21/16.    Physical exam Vitals:   02/14/16 1410 02/14/16 1834 02/15/16 0345 02/15/16 0800  BP: 119/72 111/61 131/82 124/73  Pulse: 79 83 78 68  Resp: 18 18 18 18   Temp: 97.8 F (36.6 C) 98.9 F (37.2 C) 98.7 F (37.1 C) 98.3 F (36.8 C)  TempSrc: Oral Oral Oral Oral  SpO2: 99% 98% 99% 99%  Weight:      Height:       General: alert, cooperative and no distress Lochia: appropriate Uterine Fundus: firm Incision: N/A DVT Evaluation: No evidence of DVT seen on physical exam. Negative Homan's sign. Labs: Lab Results  Component Value Date   WBC 20.6 (H) 02/14/2016   HGB 10.3 (L) 02/14/2016   HCT 30.9 (L) 02/14/2016   MCV 77.1 (L) 02/14/2016   PLT 164 02/14/2016   CMP  Latest Ref Rng & Units 08/29/2015  Glucose 65 - 99 mg/dL 130(Q)  BUN 6 - 20 mg/dL 7  Creatinine 6.57 - 8.46 mg/dL 9.62  Sodium 952 - 841 mmol/L 134(L)  Potassium 3.5 - 5.1 mmol/L 3.6  Chloride 101 - 111 mmol/L 100(L)  CO2 22 - 32 mmol/L 21(L)  Calcium 8.9 - 10.3 mg/dL 9.1  Total Protein 6.5 - 8.1 g/dL 6.8  Total Bilirubin 0.3 - 1.2 mg/dL 0.4  Alkaline Phos 38 - 126 U/L 43  AST 15 - 41 U/L 27  ALT 14 - 54 U/L 22    Discharge instruction: per After Visit Summary and "Baby and Me Booklet".  After Visit Meds:    Medication List    STOP taking these medications   fluticasone 0.05 % cream Commonly known as:  CUTIVATE   ondansetron 8 MG disintegrating tablet Commonly known as:  ZOFRAN ODT     TAKE these medications  ibuprofen 600 MG tablet Commonly known as:  ADVIL,MOTRIN Take 1 tablet (600 mg total) by mouth every 6 (six) hours.   Oxycodone HCl 10 MG Tabs Take 1 tablet (10 mg total) by mouth every 4 (four) hours as needed (pain scale > 7).   prenatal multivitamin Tabs tablet Take 1 tablet by mouth daily at 12 noon.       Diet: routine diet  Activity: Advance as tolerated. Pelvic rest for 6 weeks.   Outpatient follow up:6 weeks Follow up Appt:No future appointments. Follow up visit: No Follow-up on file.  Postpartum contraception: Tubal Ligation  Newborn Data: Live born female  Birth Weight: 8 lb 14.5 oz (4040 g) APGAR: 1, 8  Baby Feeding: Bottle and Breast Disposition:home with mother   02/21/2016 Wyvonnia DuskyMarie Lawson, CNM

## 2016-05-05 ENCOUNTER — Encounter (HOSPITAL_COMMUNITY): Payer: Self-pay

## 2016-08-11 ENCOUNTER — Encounter (HOSPITAL_COMMUNITY): Payer: Self-pay

## 2016-09-06 ENCOUNTER — Encounter (HOSPITAL_COMMUNITY): Payer: Self-pay

## 2016-10-18 ENCOUNTER — Encounter (HOSPITAL_COMMUNITY): Payer: Self-pay

## 2017-01-31 ENCOUNTER — Encounter (HOSPITAL_COMMUNITY): Payer: Self-pay

## 2017-02-03 ENCOUNTER — Encounter (HOSPITAL_COMMUNITY): Payer: Self-pay | Admitting: Emergency Medicine

## 2017-02-03 DIAGNOSIS — R350 Frequency of micturition: Secondary | ICD-10-CM | POA: Insufficient documentation

## 2017-02-03 DIAGNOSIS — Z5321 Procedure and treatment not carried out due to patient leaving prior to being seen by health care provider: Secondary | ICD-10-CM | POA: Insufficient documentation

## 2017-02-03 LAB — URINALYSIS, ROUTINE W REFLEX MICROSCOPIC
BILIRUBIN URINE: NEGATIVE
Glucose, UA: NEGATIVE mg/dL
Hgb urine dipstick: NEGATIVE
Ketones, ur: NEGATIVE mg/dL
Leukocytes, UA: NEGATIVE
NITRITE: NEGATIVE
Protein, ur: NEGATIVE mg/dL
SPECIFIC GRAVITY, URINE: 1.018 (ref 1.005–1.030)
pH: 8 (ref 5.0–8.0)

## 2017-02-03 LAB — POC URINE PREG, ED: PREG TEST UR: NEGATIVE

## 2017-02-03 NOTE — ED Triage Notes (Signed)
Pt reports urinary frequency and pain with urination ongoing since foley placement last year with child birth. States worse over the last week. Concerned she might have a UTI.

## 2017-02-04 ENCOUNTER — Emergency Department (HOSPITAL_COMMUNITY)
Admission: EM | Admit: 2017-02-04 | Discharge: 2017-02-04 | Disposition: A | Discharge status: Home / Self Care | Payer: Self-pay | Attending: Emergency Medicine | Admitting: Emergency Medicine

## 2017-02-04 NOTE — ED Notes (Signed)
Pt leaving due to wait encouraged pt to stay but states she has to work in morning.

## 2017-09-06 ENCOUNTER — Emergency Department (HOSPITAL_COMMUNITY)
Admission: EM | Admit: 2017-09-06 | Discharge: 2017-09-06 | Disposition: A | Discharge status: Home / Self Care | Payer: BLUE CROSS/BLUE SHIELD | Attending: Emergency Medicine | Admitting: Emergency Medicine

## 2017-09-06 ENCOUNTER — Other Ambulatory Visit: Payer: Self-pay

## 2017-09-06 ENCOUNTER — Encounter (HOSPITAL_COMMUNITY): Payer: Self-pay

## 2017-09-06 DIAGNOSIS — Y999 Unspecified external cause status: Secondary | ICD-10-CM | POA: Diagnosis not present

## 2017-09-06 DIAGNOSIS — Y929 Unspecified place or not applicable: Secondary | ICD-10-CM | POA: Diagnosis not present

## 2017-09-06 DIAGNOSIS — Y939 Activity, unspecified: Secondary | ICD-10-CM | POA: Diagnosis not present

## 2017-09-06 DIAGNOSIS — M62830 Muscle spasm of back: Secondary | ICD-10-CM | POA: Diagnosis not present

## 2017-09-06 DIAGNOSIS — Z79899 Other long term (current) drug therapy: Secondary | ICD-10-CM | POA: Insufficient documentation

## 2017-09-06 DIAGNOSIS — Z87891 Personal history of nicotine dependence: Secondary | ICD-10-CM | POA: Diagnosis not present

## 2017-09-06 DIAGNOSIS — M545 Low back pain: Secondary | ICD-10-CM | POA: Diagnosis present

## 2017-09-06 MED ORDER — NAPROXEN 375 MG PO TABS: 375.0000 mg | ORAL_TABLET | Freq: Two times a day (BID) | ORAL | 0 refills | Status: DC

## 2017-09-06 MED ORDER — CYCLOBENZAPRINE HCL 10 MG PO TABS: 10.0000 mg | ORAL_TABLET | Freq: Two times a day (BID) | ORAL | 0 refills | Status: DC | PRN

## 2017-09-06 NOTE — Discharge Instructions (Signed)
Do not take the muscle relaxer if driving or at work as it will make you sleepy. Follow up with your doctor or return here if symptoms worsen.
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
## 2019-05-16 ENCOUNTER — Other Ambulatory Visit: Payer: Self-pay

## 2019-05-16 DIAGNOSIS — Z20822 Contact with and (suspected) exposure to covid-19: Secondary | ICD-10-CM

## 2019-05-18 ENCOUNTER — Ambulatory Visit: Payer: Self-pay | Admitting: *Deleted

## 2019-05-18 LAB — NOVEL CORONAVIRUS, NAA: SARS-CoV-2, NAA: DETECTED — AB

## 2019-05-18 NOTE — Telephone Encounter (Signed)
Patient is calling because she received her COVID test results on MyChart. Patient has some questions about the quarantine period. Patient is reporting headaches, discomfort in the chest area. Please advise.    Returned call to patient regarding how long her son should be in quarantine. He was diagnosed with covid-19 last Friday. He is advised to be in quarantine for 10 days unless he has symptoms that would keep him in quarantine longer. If he has a fever and has to have medication to get it down. She stated he has not had a fever, just cough, sneezing and running nose. The day care never shut down just his area was closed. She is also advised that because she is positive he would still have to be in quarantine with her and the rest of the family. Advised also to check with the pediatrician. She voiced understanding.

## 2019-05-19 ENCOUNTER — Telehealth: Payer: Self-pay | Admitting: Nurse Practitioner

## 2019-05-19 NOTE — Telephone Encounter (Signed)
Discussed with patient about Covid symptoms and assessed for [facility] treatment qualification.  Patient reports mild headache and symptoms starting about 7 days ago.  She denies any high risk factors such as asthma, HTN, cancer, obesity.  She did inquire into her son's results while on the phone, Stevphen Meuse, reviewed his chart to ensure matching info and updated on status.  At this time does not qualify for Bamlanivimab.  Have recommended entire family perform home isolation for the next 10 days since positive testing present in household.  If worsening, increasing symptoms such as SOB, difficulty breathing, then to seek immediate care.

## 2019-10-29 ENCOUNTER — Ambulatory Visit: Payer: Self-pay | Attending: Internal Medicine

## 2019-10-29 DIAGNOSIS — Z20822 Contact with and (suspected) exposure to covid-19: Secondary | ICD-10-CM

## 2019-10-30 LAB — SARS-COV-2, NAA 2 DAY TAT

## 2019-10-30 LAB — NOVEL CORONAVIRUS, NAA: SARS-CoV-2, NAA: NOT DETECTED

## 2020-03-03 DIAGNOSIS — Z87891 Personal history of nicotine dependence: Secondary | ICD-10-CM | POA: Insufficient documentation

## 2020-03-03 DIAGNOSIS — M542 Cervicalgia: Secondary | ICD-10-CM | POA: Insufficient documentation

## 2020-03-04 ENCOUNTER — Encounter (HOSPITAL_COMMUNITY): Payer: Self-pay | Admitting: Emergency Medicine

## 2020-03-04 ENCOUNTER — Emergency Department (HOSPITAL_COMMUNITY)
Admission: EM | Admit: 2020-03-04 | Discharge: 2020-03-04 | Disposition: A | Discharge status: Home / Self Care | Payer: No Typology Code available for payment source | Attending: Emergency Medicine | Admitting: Emergency Medicine

## 2020-03-04 MED ORDER — ACETAMINOPHEN 500 MG PO TABS
1000.0000 mg | ORAL_TABLET | Freq: Once | ORAL | Status: AC
  Administered 2020-03-04: 1000 mg via ORAL
  Filled 2020-03-04: qty 2

## 2020-03-04 MED ORDER — NAPROXEN 375 MG PO TABS: 375.0000 mg | ORAL_TABLET | Freq: Two times a day (BID) | ORAL | 0 refills | Status: DC

## 2020-03-04 MED ORDER — CYCLOBENZAPRINE HCL 10 MG PO TABS: 10.0000 mg | ORAL_TABLET | Freq: Two times a day (BID) | ORAL | 0 refills | Status: DC | PRN
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
## 2020-03-04 NOTE — Progress Notes (Signed)
Orthopedic Tech Progress Note Patient Details:  [name] 1986-04-07 [id number]  Ortho Devices Type of Ortho Device: Knee Sleeve Ortho Device/Splint Location: Left Knee Ortho Device/Splint Interventions: Application   Post Interventions Patient Tolerated: Well Instructions Provided: Adjustment of device   [name] 03/04/2020, 1:31 AM
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
## 2021-11-06 ENCOUNTER — Encounter: Payer: Self-pay | Admitting: Family Medicine

## 2021-11-06 ENCOUNTER — Ambulatory Visit (INDEPENDENT_AMBULATORY_CARE_PROVIDER_SITE_OTHER): Payer: 59 | Admitting: Family Medicine

## 2021-11-06 ENCOUNTER — Other Ambulatory Visit (HOSPITAL_COMMUNITY)
Admission: RE | Admit: 2021-11-06 | Discharge: 2021-11-06 | Disposition: A | Discharge status: Home / Self Care | Payer: 59 | Source: Ambulatory Visit | Attending: Family Medicine | Admitting: Family Medicine

## 2021-11-06 VITALS — BP 126/74 | HR 60 | Temp 97.2°F | Ht 64.0 in | Wt 167.0 lb

## 2021-11-06 DIAGNOSIS — B977 Papillomavirus as the cause of diseases classified elsewhere: Secondary | ICD-10-CM

## 2021-11-06 DIAGNOSIS — F17210 Nicotine dependence, cigarettes, uncomplicated: Secondary | ICD-10-CM

## 2021-11-06 DIAGNOSIS — N898 Other specified noninflammatory disorders of vagina: Secondary | ICD-10-CM | POA: Insufficient documentation

## 2021-11-06 DIAGNOSIS — R87619 Unspecified abnormal cytological findings in specimens from cervix uteri: Secondary | ICD-10-CM

## 2021-11-06 DIAGNOSIS — Z8742 Personal history of other diseases of the female genital tract: Secondary | ICD-10-CM | POA: Diagnosis not present

## 2021-11-06 NOTE — Patient Instructions (Addendum)
Obgyn Offices:   [facility] [address] [place], [place] [address] [phone]  [facility] Address: [address] [place], [place] [address] Phone: [phone]  [facility] [address] [place], [place] [address] Phone: [phone]  [facility] [address] [place], [place] [address] Phone: [phone]   Managing the Challenge of Quitting Smoking Quitting smoking is a physical and mental challenge. You may have cravings, withdrawal symptoms, and temptation to smoke. Before quitting, work with your health care provider to make a plan that can help you manage quitting. Making a plan before you quit may keep you from smoking when you have the urge to smoke while trying to quit. How to manage lifestyle changes Managing stress Stress can make you want to smoke, and wanting to smoke may cause stress. It is important to find ways to manage your stress. You could try some of the following: Practice relaxation techniques. Breathe slowly and deeply, in through your nose and out through your mouth. Listen to music. Soak in a bath or take a shower. Imagine a peaceful place or vacation. Get some support. Talk with family or friends about your stress. Join a support group. Talk with a counselor or therapist. Get some physical activity. Go for a walk, run, or bike ride. Play a favorite sport. Practice yoga.  Medicines Talk with your health care provider about medicines that might help you deal with cravings and make quitting easier for you. Relationships Social situations can be difficult when you are quitting smoking. To manage this, you can: Avoid parties and other social situations where people might be smoking. Avoid alcohol. Leave right away if you have the urge to smoke. Explain to your family and friends that you are quitting smoking. Ask for support and let them know you  might be a bit grumpy. Plan activities where smoking is not an option. General instructions Be aware that many people gain weight after they quit smoking. However, not everyone does. To keep from gaining weight, have a plan in place before you quit, and stick to the plan after you quit. Your plan should include: Eating healthy snacks. When you have a craving, it may help to: Eat popcorn, or try carrots, celery, or other cut vegetables. Chew sugar-free gum. Changing how you eat. Eat small portion sizes at meals. Eat 4-6 small meals throughout the day instead of 1-2 large meals a day. Be mindful when you eat. You should avoid watching television or doing other things that might distract you as you eat. Exercising regularly. Make time to exercise each day. If you do not have time for a long workout, do short bouts of exercise for 5-10 minutes several times a day. Do some form of strengthening exercise, such as weight lifting. Do some exercise that gets your heart beating and causes you to breathe deeply, such as walking fast, running, swimming, or biking. This is very important. Drinking plenty of water or other low-calorie or no-calorie drinks. Drink enough fluid to keep your urine pale yellow.  How to recognize withdrawal symptoms Your body and mind may experience discomfort as you try to get used to not having nicotine in your system. These effects are called withdrawal symptoms. They may include: Feeling hungrier than normal. Having trouble concentrating. Feeling irritable or restless. Having trouble sleeping. Feeling depressed. Craving a cigarette. These symptoms may surprise you, but they are normal to have when quitting smoking. To manage  withdrawal symptoms: Avoid places, people, and activities that trigger your cravings. Remember why you want to quit. Get plenty of sleep. Avoid coffee and other drinks that contain caffeine. These may worsen some of your symptoms. How to manage  cravings Come up with a plan for how to deal with your cravings. The plan should include the following: A definition of the specific situation you want to deal with. An activity or action you will take to replace smoking. A clear idea for how this action will help. The name of someone who could help you with this. Cravings usually last for 5-10 minutes. Consider taking the following actions to help you with your plan to deal with cravings: Keep your mouth busy. Chew sugar-free gum. Suck on hard candies or a straw. Brush your teeth. Keep your hands and body busy. Change to a different activity right away. Squeeze or play with a ball. Do an activity or a hobby, such as making bead jewelry, practicing needlepoint, or working with wood. Mix up your normal routine. Take a short exercise break. Go for a quick walk, or run up and down stairs. Focus on doing something kind or helpful for someone else. Call a friend or family member to talk during a craving. Join a support group. Contact a quitline. Where to find support To get help or find a support group: Call the National Cancer Institute's Smoking Quitline: 1-800-QUIT-NOW (206)108-8308) Text QUIT to SmokefreeTXT: 867544 Where to find more information Visit these websites to find more information on quitting smoking: U.S. Department of Health and Human Services: www.smokefree.gov American Lung Association: www.freedomfromsmoking.org Centers for Disease Control and Prevention (CDC): FootballExhibition.com.br American Heart Association: www.heart.org Contact a health care provider if: You want to change your plan for quitting. The medicines you are taking are not helping. Your eating feels out of control or you cannot sleep. You feel depressed or become very anxious. Summary Quitting smoking is a physical and mental challenge. You will face cravings, withdrawal symptoms, and temptation to smoke again. Preparation can help you as you go through these  challenges. Try different techniques to manage stress, handle social situations, and prevent weight gain. You can deal with cravings by keeping your mouth busy (such as by chewing gum), keeping your hands and body busy, calling family or friends, or contacting a quitline for people who want to quit smoking. You can deal with withdrawal symptoms by avoiding places where people smoke, getting plenty of rest, and avoiding drinks that contain caffeine. This information is not intended to replace advice given to you by your health care provider. Make sure you discuss any questions you have with your health care provider. Document Revised: 05/29/2021 Document Reviewed: 05/29/2021 Elsevier Patient Education  2023 [employer].

## 2021-11-06 NOTE — Progress Notes (Signed)
New Patient Office Visit  Subjective    Patient ID: BENA KOBEL, female    DOB: 30-Oct-1985  Age: 36 y.o. MRN: [id number]  CC:  Chief Complaint  Patient presents with   Establish Care    Patient had bacteria infection and was prescribed a cream starting with the letter "n" but was only enough for 4-5 days. She would like it refilled so that it can clear up.  Would also like to discuss nicotine patch, would like to stop smoking.    HPI Dola L Mallick presents to establish care  States she has vaginal discharge and thinks she still has a yeast infection. States she was prescribed a 3 day course of gel which did not take care of her symptoms.  States she is not concerned with STDs.    Hx of abnormal pap smear with HPV   Tubal ligation   Smoked for the past 20+years intermittently.  Smokes but is ready to stop. States she only smokes 1 cigarette most days now.  Using non nicotine vape.   Denies fever, chills, dizziness, chest pain, palpitations, shortness of breath, abdominal pain, N/V/D, urinary symptoms.      Outpatient Encounter Medications as of 11/06/2021  Medication Sig   [DISCONTINUED] cyclobenzaprine (FLEXERIL) 10 MG tablet Take 1 tablet (10 mg total) by mouth 2 (two) times daily as needed for muscle spasms.   [DISCONTINUED] naproxen (NAPROSYN) 375 MG tablet Take 1 tablet (375 mg total) by mouth 2 (two) times daily.   [DISCONTINUED] Prenatal Vit-Fe Fumarate-FA (PRENATAL MULTIVITAMIN) TABS tablet Take 1 tablet by mouth daily at 12 noon.    No facility-administered encounter medications on file as of 11/06/2021.    Past Medical History:  Diagnosis Date   Injury of right rotator cuff     Past Surgical History:  Procedure Laterality Date   NO PAST SURGERIES     TUBAL LIGATION Bilateral 02/14/2016   Procedure: POST PARTUM TUBAL LIGATION;  Surgeon: Adam Phenix, MD;  Location: [facility];  Service: Gynecology;  Laterality: Bilateral;    Family History  Problem  Relation Age of Onset   Cancer Other     Social History   Socioeconomic History   Marital status: Single    Spouse name: Not on file   Number of children: Not on file   Years of education: Not on file   Highest education level: Not on file  Occupational History   Not on file  Tobacco Use   Smoking status: Former    Packs/day: 0.25    Types: Cigarettes    Quit date: 05/06/2015    Years since quitting: 6.5   Smokeless tobacco: Never  Vaping Use   Vaping Use: Never used  Substance and Sexual Activity   Alcohol use: Yes    Comment: social   Drug use: No   Sexual activity: Never    Birth control/protection: None    Comment: pt plans to get tubes tied  Other Topics Concern   Not on file  Social History Narrative   Not on file   Social Determinants of Health   Financial Resource Strain: Not on file  Food Insecurity: Not on file  Transportation Needs: Not on file  Physical Activity: Not on file  Stress: Not on file  Social Connections: Not on file  Intimate Partner Violence: Not on file    ROS      Objective    BP 126/74 (BP Location: Left Arm, Patient Position: Sitting, Cuff  Size: Large)   Pulse 60   Temp (!) 97.2 F (36.2 C) (Temporal)   Ht 5\' 4"  (1.626 m)   Wt 167 lb (75.8 kg)   LMP 10/27/2021   SpO2 99%   BMI 28.67 kg/m   Physical Exam Constitutional:      Appearance: Normal appearance.  Cardiovascular:     Rate and Rhythm: Normal rate and regular rhythm.  Pulmonary:     Effort: Pulmonary effort is normal.     Breath sounds: Normal breath sounds.  Musculoskeletal:     Cervical back: Normal range of motion.  Neurological:     Mental Status: She is alert and oriented to person, place, and time.     Gait: Gait normal.  Psychiatric:        Mood and Affect: Mood normal.        Behavior: Behavior normal.        Assessment & Plan:   Problem List Items Addressed This Visit       Other   Cigarette smoker motivated to quit    Counseling on  smoking cessation. Recommend coming up with healthy habit to substitute for daily cigarette.        History of abnormal cervical Pap smear   HPV in female    Referral to gyn       PAP SMEAR, ABNORMAL    Referral to gyn       Vaginal discharge - Primary    Self swab done. Follow up pending results. Declines HIV, RPR       Relevant Orders   Cervicovaginal ancillary only( [facility])    Return for pending labs.   12/27/2021, NP-C

## 2021-11-08 NOTE — Assessment & Plan Note (Signed)
Referral to gyn

## 2021-11-08 NOTE — Assessment & Plan Note (Signed)
Self swab done. Follow up pending results. Declines HIV, RPR

## 2021-11-08 NOTE — Assessment & Plan Note (Signed)
Counseling on smoking cessation. Recommend coming up with healthy habit to substitute for daily cigarette.

## 2021-11-09 LAB — CERVICOVAGINAL ANCILLARY ONLY
Bacterial Vaginitis (gardnerella): POSITIVE — AB
Candida Glabrata: NEGATIVE
Candida Vaginitis: NEGATIVE
Chlamydia: NEGATIVE
Comment: NEGATIVE
Comment: NEGATIVE
Comment: NEGATIVE
Comment: NEGATIVE
Comment: NEGATIVE
Comment: NORMAL
Neisseria Gonorrhea: NEGATIVE
Trichomonas: NEGATIVE

## 2021-11-09 NOTE — Progress Notes (Signed)
She is positive for bacterial vaginitis. She is negative for yeast and sexually transmitted infections. We usually treat BV with an oral antibiotic called metronidazole as long as she does not drink alcohol with the medication ( it can make her sick). If she prefers, we can use a vaginal applicator instead. Please let me know and I will send this to her pharmacy.

## 2021-11-10 ENCOUNTER — Other Ambulatory Visit: Payer: Self-pay | Admitting: Family Medicine

## 2021-11-10 DIAGNOSIS — B9689 Other specified bacterial agents as the cause of diseases classified elsewhere: Secondary | ICD-10-CM

## 2021-11-10 MED ORDER — METRONIDAZOLE 500 MG PO TABS: 500.0000 mg | ORAL_TABLET | Freq: Two times a day (BID) | ORAL | 0 refills | Status: DC

## 2021-12-30 DIAGNOSIS — R350 Frequency of micturition: Secondary | ICD-10-CM | POA: Diagnosis not present

## 2021-12-30 DIAGNOSIS — Z1151 Encounter for screening for human papillomavirus (HPV): Secondary | ICD-10-CM | POA: Diagnosis not present

## 2021-12-30 DIAGNOSIS — Z113 Encounter for screening for infections with a predominantly sexual mode of transmission: Secondary | ICD-10-CM | POA: Diagnosis not present

## 2021-12-30 DIAGNOSIS — Z124 Encounter for screening for malignant neoplasm of cervix: Secondary | ICD-10-CM | POA: Diagnosis not present

## 2021-12-30 DIAGNOSIS — R69 Illness, unspecified: Secondary | ICD-10-CM | POA: Diagnosis not present

## 2021-12-30 DIAGNOSIS — R102 Pelvic and perineal pain: Secondary | ICD-10-CM | POA: Diagnosis not present

## 2021-12-30 DIAGNOSIS — N898 Other specified noninflammatory disorders of vagina: Secondary | ICD-10-CM | POA: Diagnosis not present

## 2021-12-30 DIAGNOSIS — Z01419 Encounter for gynecological examination (general) (routine) without abnormal findings: Secondary | ICD-10-CM | POA: Diagnosis not present

## 2022-01-20 DIAGNOSIS — R102 Pelvic and perineal pain: Secondary | ICD-10-CM | POA: Diagnosis not present

## 2022-01-20 DIAGNOSIS — N839 Noninflammatory disorder of ovary, fallopian tube and broad ligament, unspecified: Secondary | ICD-10-CM | POA: Diagnosis not present

## 2022-01-20 DIAGNOSIS — D259 Leiomyoma of uterus, unspecified: Secondary | ICD-10-CM | POA: Diagnosis not present

## 2022-02-16 DIAGNOSIS — D369 Benign neoplasm, unspecified site: Secondary | ICD-10-CM | POA: Diagnosis not present

## 2022-02-16 DIAGNOSIS — R9389 Abnormal findings on diagnostic imaging of other specified body structures: Secondary | ICD-10-CM | POA: Diagnosis not present

## 2022-02-16 DIAGNOSIS — R102 Pelvic and perineal pain: Secondary | ICD-10-CM | POA: Diagnosis not present

## 2022-12-25 ENCOUNTER — Emergency Department (HOSPITAL_COMMUNITY): Payer: BLUE CROSS/BLUE SHIELD

## 2022-12-25 ENCOUNTER — Other Ambulatory Visit: Payer: Self-pay

## 2022-12-25 ENCOUNTER — Emergency Department (HOSPITAL_COMMUNITY)
Admission: EM | Admit: 2022-12-25 | Discharge: 2022-12-25 | Disposition: A | Discharge status: Home / Self Care | Payer: BLUE CROSS/BLUE SHIELD | Attending: Emergency Medicine | Admitting: Emergency Medicine

## 2022-12-25 ENCOUNTER — Encounter (HOSPITAL_COMMUNITY): Payer: Self-pay

## 2022-12-25 DIAGNOSIS — R102 Pelvic and perineal pain: Secondary | ICD-10-CM | POA: Diagnosis present

## 2022-12-25 DIAGNOSIS — Z91040 Latex allergy status: Secondary | ICD-10-CM | POA: Diagnosis not present

## 2022-12-25 DIAGNOSIS — N898 Other specified noninflammatory disorders of vagina: Secondary | ICD-10-CM | POA: Insufficient documentation

## 2022-12-25 LAB — URINALYSIS, ROUTINE W REFLEX MICROSCOPIC
Bacteria, UA: NONE SEEN
Bilirubin Urine: NEGATIVE
Glucose, UA: NEGATIVE mg/dL
Hgb urine dipstick: NEGATIVE
Ketones, ur: 5 mg/dL — AB
Leukocytes,Ua: NEGATIVE
Nitrite: NEGATIVE
Protein, ur: 30 mg/dL — AB
Specific Gravity, Urine: 1.028 (ref 1.005–1.030)
pH: 6 (ref 5.0–8.0)

## 2022-12-25 LAB — WET PREP, GENITAL
Clue Cells Wet Prep HPF POC: NONE SEEN
Sperm: NONE SEEN
Trich, Wet Prep: NONE SEEN
WBC, Wet Prep HPF POC: >=10 — AB (ref <10)
Yeast Wet Prep HPF POC: NONE SEEN

## 2022-12-25 LAB — PREGNANCY, URINE: Preg Test, Ur: NEGATIVE

## 2022-12-25 LAB — HIV ANTIBODY (ROUTINE TESTING W REFLEX): HIV Screen 4th Generation wRfx: NONREACTIVE

## 2022-12-25 MED ORDER — IBUPROFEN 200 MG PO TABS
600.0000 mg | ORAL_TABLET | Freq: Once | ORAL | Status: AC
  Administered 2022-12-25: 600 mg via ORAL
  Filled 2022-12-25: qty 3

## 2022-12-25 MED ORDER — ACETAMINOPHEN 500 MG PO TABS
1000.0000 mg | ORAL_TABLET | Freq: Once | ORAL | Status: AC
  Administered 2022-12-25: 1000 mg via ORAL
  Filled 2022-12-25: qty 2

## 2022-12-25 NOTE — ED Provider Notes (Signed)
[place] EMERGENCY DEPARTMENT AT [facility] Provider Note   CSN: [id number] Arrival date & time: 12/25/22  1516     History  Chief Complaint  Patient presents with   Vaginal Discharge    [name] is a 37 y.o. female.  Patient is a 37 year old female with no significant past medical history presenting to the emergency department with pelvic pain.  Patient states for about the last week she has had lower abdominal pelvic pain.  She states that she has had increased yellow vaginal discharge.  She states that she does have a history of an ovarian cyst and fibroids and is unsure if her pain could be related to this.  She states that she has had some dysuria but denies any hematuria.  She denies any fevers, nausea or vomiting or flank pain.  She denies any new sexual contacts or STD concern.  The history is provided by the patient.  Vaginal Discharge      Home Medications Prior to Admission medications   Medication Sig Start Date End Date Taking? Authorizing Provider  metroNIDAZOLE (FLAGYL) 500 MG tablet Take 1 tablet (500 mg total) by mouth 2 (two) times daily. Patient not taking: Reported on 12/25/2022 11/10/21   Avanell Shackleton, NP-C      Allergies    Latex    Review of Systems   Review of Systems  Genitourinary:  Positive for vaginal discharge.    Physical Exam Updated Vital Signs BP 125/69 (BP Location: Left Arm)   Pulse 79   Temp 98.9 F (37.2 C) (Oral)   Resp 18   Ht 5\' 4"  (1.626 m)   Wt 72.6 kg   LMP 12/09/2022 (Approximate)   SpO2 100%   BMI 27.46 kg/m  Physical Exam Vitals and nursing note reviewed.  Constitutional:      General: She is not in acute distress.    Appearance: Normal appearance.  HENT:     Head: Normocephalic and atraumatic.     Nose: Nose normal.     Mouth/Throat:     Mouth: Mucous membranes are moist.     Pharynx: Oropharynx is clear.  Eyes:     Extraocular Movements: Extraocular movements intact.      Conjunctiva/sclera: Conjunctivae normal.  Cardiovascular:     Rate and Rhythm: Normal rate and regular rhythm.     Heart sounds: Normal heart sounds.  Pulmonary:     Effort: Pulmonary effort is normal.     Breath sounds: Normal breath sounds.  Abdominal:     General: Abdomen is flat.     Palpations: Abdomen is soft.     Tenderness: There is no abdominal tenderness.  Musculoskeletal:        General: Normal range of motion.     Cervical back: Normal range of motion.  Skin:    General: Skin is warm and dry.  Neurological:     General: No focal deficit present.     Mental Status: She is alert and oriented to person, place, and time.  Psychiatric:        Mood and Affect: Mood normal.        Behavior: Behavior normal.     ED Results / Procedures / Treatments   Labs (all labs ordered are listed, but only abnormal results are displayed) Labs Reviewed  WET PREP, GENITAL - Abnormal; Notable for the following components:      Result Value   WBC, Wet Prep HPF POC >=10 (*)  All other components within normal limits  URINALYSIS, ROUTINE W REFLEX MICROSCOPIC - Abnormal; Notable for the following components:   APPearance HAZY (*)    Ketones, ur 5 (*)    Protein, ur 30 (*)    All other components within normal limits  PREGNANCY, URINE  RPR  HIV ANTIBODY (ROUTINE TESTING W REFLEX)  GC/CHLAMYDIA PROBE AMP ([place]) NOT AT [facility]    EKG None  Radiology US Pelvis Complete  Result Date: 12/25/2022 CLINICAL DATA:  Right adnexal pain for 1 week. EXAM: TRANSABDOMINAL ULTRASOUND OF PELVIS DOPPLER ULTRASOUND OF OVARIES TECHNIQUE: Transabdominal ultrasound examination of the pelvis was performed including evaluation of the uterus, ovaries, adnexal regions, and pelvic cul-de-sac. Color and duplex Doppler ultrasound was utilized to evaluate blood flow to the ovaries. COMPARISON:  Pelvic ultrasound 09/10/2013 FINDINGS: Uterus Measurements: 8.8 cm x 4.2 cm x 5.3 cm = volume: 102 mL. No fibroids  or other mass visualized. Endometrium Thickness: 15 mm.  No focal abnormality visualized. Right ovary Measurements: 3.6 cm x 2.2 cm x 3.4 cm = volume: 14 mL. There is a 1.4 cm simple appearing cyst in the right ovary requiring no specific imaging follow-up. The right ovary is otherwise normal in appearance. Left ovary Measurements: 3.9 cm x 2.5 cm x 2.8 cm = volume: 14 mL. Normal appearance/no adnexal mass. Pulsed Doppler evaluation demonstrates normal low-resistance arterial and venous waveforms in both ovaries. Other: There is no free fluid in the pelvis. IMPRESSION: 1. No evidence of torsion or other acute finding in the pelvis. 2. 1.4 cm simple right ovarian cyst requiring no specific imaging follow-up. Electronically Signed   By: Lesia Hausen M.D.   On: 12/25/2022 18:11   [place] Art/Ven Flow Abd Pelv Doppler  Result Date: 12/25/2022 CLINICAL DATA:  Right adnexal pain for 1 week. EXAM: TRANSABDOMINAL ULTRASOUND OF PELVIS DOPPLER ULTRASOUND OF OVARIES TECHNIQUE: Transabdominal ultrasound examination of the pelvis was performed including evaluation of the uterus, ovaries, adnexal regions, and pelvic cul-de-sac. Color and duplex Doppler ultrasound was utilized to evaluate blood flow to the ovaries. COMPARISON:  Pelvic ultrasound 09/10/2013 FINDINGS: Uterus Measurements: 8.8 cm x 4.2 cm x 5.3 cm = volume: 102 mL. No fibroids or other mass visualized. Endometrium Thickness: 15 mm.  No focal abnormality visualized. Right ovary Measurements: 3.6 cm x 2.2 cm x 3.4 cm = volume: 14 mL. There is a 1.4 cm simple appearing cyst in the right ovary requiring no specific imaging follow-up. The right ovary is otherwise normal in appearance. Left ovary Measurements: 3.9 cm x 2.5 cm x 2.8 cm = volume: 14 mL. Normal appearance/no adnexal mass. Pulsed Doppler evaluation demonstrates normal low-resistance arterial and venous waveforms in both ovaries. Other: There is no free fluid in the pelvis. IMPRESSION: 1. No evidence of torsion  or other acute finding in the pelvis. 2. 1.4 cm simple right ovarian cyst requiring no specific imaging follow-up. Electronically Signed   By: Lesia Hausen M.D.   On: 12/25/2022 18:11    Procedures Procedures    Medications Ordered in ED Medications  acetaminophen (TYLENOL) tablet 1,000 mg (has no administration in time range)  ibuprofen (ADVIL) tablet 600 mg (has no administration in time range)    ED Course/ Medical Decision Making/ A&P Clinical Course as of 12/25/22 1844  Sat Dec 25, 2022  1831 Simple ovarian cyst on ultrasound without signs of torsion or TOA. Wet prep negatvie besides WBC. Will be offered prophylactic STI treatment. [VK]  1840 Patient declined STI treatment at this  time. She is stable for discharge home with GYN follow up. [VK]    Clinical Course User Index [VK] Rexford Maus, DO                             Medical Decision Making This patient presents to the ED with chief complaint(s) of pelvic pain with no pertinent past medical history which further complicates the presenting complaint. The complaint involves an extensive differential diagnosis and also carries with it a high risk of complications and morbidity.    The differential diagnosis includes pregnancy, UTI, STI, BV, PID, ovarian torsion less likely as no point tenderness in the setting of the chronicity of her pain  Additional history obtained: Additional history obtained from N/A Records reviewed Primary Care Documents  ED Course and Reassessment: On patient's arrival to the the emergency department she is hemodynamically stable in no acute distress.  Patient will have pelvic exam and urine performed.  She is agreeable for STI testing.  Independent labs interpretation:  The following labs were independently interpreted: WBCs on wet prep otherwise negative  Independent visualization of imaging: - I independently visualized the following imaging with scope of interpretation limited to  determining acute life threatening conditions related to emergency care: TVUS, which revealed no evidence of TOA or torsion  Consultation: - Consulted or discussed management/test interpretation w/ external professional: N/A  Consideration for admission or further workup: Patient has no emergent conditions requiring admission or further work-up at this time and is stable for discharge home with primary care follow-up  Social Determinants of health: N/A    Amount and/or Complexity of Data Reviewed Labs: ordered. Radiology: ordered.  Risk OTC drugs.          Final Clinical Impression(s) / ED Diagnoses Final diagnoses:  Pelvic pain    Rx / DC Orders ED Discharge Orders     None         Rexford Maus, DO 12/25/22 1844

## 2022-12-25 NOTE — Discharge Instructions (Signed)
You were seen in the emergency department for your pelvic pain.  Your workup showed no obvious infection and no signs of twisting of your ovaries.  We did test you for gonorrhea and chlamydia that take about 24 hours to come back and if either of these come back positive you will receive a call and have antibiotics sent to your pharmacy.  You can continue to take Tylenol and Motrin as needed for pain.  You can follow-up with your primary doctor or your GYN to have your symptoms rechecked.  You should return to the emergency department for significantly worsening pain, fevers or any other new or concerning symptoms.

## 2022-12-25 NOTE — ED Triage Notes (Signed)
"  Having some pain in right pelvic area. Have a history of ovarian cysts and fibroids so pain may be from that, but I think it may be a bacterial infection because I have been having some light yellow vaginal discharge x 1 week" per pt

## 2022-12-26 LAB — RPR: RPR Ser Ql: NONREACTIVE

## 2022-12-27 LAB — GC/CHLAMYDIA PROBE AMP (~~LOC~~) NOT AT ARMC
Chlamydia: NEGATIVE
Comment: NEGATIVE
Comment: NORMAL
Neisseria Gonorrhea: NEGATIVE

## 2023-02-09 ENCOUNTER — Encounter: Payer: BLUE CROSS/BLUE SHIELD | Admitting: Family Medicine

## 2023-04-01 ENCOUNTER — Ambulatory Visit: Payer: Medicaid Other | Admitting: Family Medicine

## 2023-04-12 ENCOUNTER — Other Ambulatory Visit: Payer: Self-pay | Admitting: Physician Assistant

## 2023-04-12 DIAGNOSIS — G8929 Other chronic pain: Secondary | ICD-10-CM

## 2023-04-20 ENCOUNTER — Encounter: Payer: Self-pay | Admitting: Physician Assistant

## 2023-05-01 ENCOUNTER — Ambulatory Visit
Admission: RE | Admit: 2023-05-01 | Discharge: 2023-05-01 | Disposition: A | Discharge status: Home / Self Care | Payer: Medicaid Other | Source: Ambulatory Visit | Attending: Physician Assistant | Admitting: Physician Assistant

## 2023-05-01 DIAGNOSIS — G8929 Other chronic pain: Secondary | ICD-10-CM

## 2023-07-14 ENCOUNTER — Encounter (HOSPITAL_COMMUNITY): Payer: Self-pay

## 2023-07-14 NOTE — Patient Instructions (Addendum)
SURGICAL WAITING ROOM VISITATION Patients having surgery or a procedure may have no more than 2 support people in the waiting area - these visitors may rotate.    Children under the age of 97 must have an adult with them who is not the patient.  Due to an increase in RSV and influenza rates and associated hospitalizations, children ages 66 and under may not visit patients in [facility] hospitals.   If the patient needs to stay at the hospital during part of their recovery, the visitor guidelines for inpatient rooms apply. Pre-op nurse will coordinate an appropriate time for 1 support person to accompany patient in pre-op.  This support person may not rotate.    Please refer to the [facility] website for the visitor guidelines for Inpatients (after your surgery is over and you are in a regular room).       Your procedure is scheduled on: 07-28-23   Report to [facility] Main Entrance    Report to admitting at 12:45 PM   Call this number if you have problems the morning of surgery [phone]   Do not eat food :After Midnight.   After Midnight you may have the following liquids until 12:00 PM DAY OF SURGERY  Water Non-Citrus Juices (without pulp, NO RED-Apple, White grape, White cranberry) Black Coffee (NO MILK/CREAM OR CREAMERS, sugar ok)  Clear Tea (NO MILK/CREAM OR CREAMERS, sugar ok) regular and decaf                             Plain Jell-O (NO RED)                                           Fruit ices (not with fruit pulp, NO RED)                                     Popsicles (NO RED)                                                               Sports drinks like Gatorade (NO RED)                   The day of surgery:  Drink ONE (1) Pre-Surgery Clear Ensure by 12:00 PM the morning of surgery. Drink in one sitting. Do not sip.  This drink was given to you during your hospital  pre-op appointment visit. Nothing else to drink after completing the Pre-Surgery Clear  Ensure.          If you have questions, please contact your surgeon's office.   FOLLOW  ANY ADDITIONAL PRE OP INSTRUCTIONS YOU RECEIVED FROM YOUR SURGEON'S OFFICE!!!     Oral Hygiene is also important to reduce your risk of infection.                                    Remember - BRUSH YOUR TEETH THE MORNING OF SURGERY WITH YOUR REGULAR TOOTHPASTE  Do NOT smoke after Midnight   Take these medicines the morning of surgery with A SIP OF WATER:  None  Stop all vitamins and herbal supplements 7 days before surgery                              You may not have any metal on your body including hair pins, jewelry, and body piercing             Do not wear make-up, lotions, powders, perfumes or deodorant  Do not wear nail polish including gel and S&S, artificial/acrylic nails, or any other type of covering on natural nails including finger and toenails. If you have artificial nails, gel coating, etc. that needs to be removed by a nail salon please have this removed prior to surgery or surgery may need to be canceled/ delayed if the surgeon/ anesthesia feels like they are unable to be safely monitored.   Do not shave  48 hours prior to surgery.    Do not bring valuables to the hospital. [facility] IS NOT RESPONSIBLE   FOR VALUABLES.   Contacts, dentures or bridgework may not be worn into surgery.  DO NOT BRING YOUR HOME MEDICATIONS TO THE HOSPITAL. PHARMACY WILL DISPENSE MEDICATIONS LISTED ON YOUR MEDICATION LIST TO YOU DURING YOUR ADMISSION IN THE HOSPITAL!    Patients discharged on the day of surgery will not be allowed to drive home.  Someone NEEDS to stay with you for the first 24 hours after anesthesia.    Please read over the following fact sheets you were given: IF YOU HAVE QUESTIONS ABOUT YOUR PRE-OP INSTRUCTIONS PLEASE CALL [phone] [place]  If you received a COVID test during your pre-op visit  it is requested that you wear a mask when out in public, stay away from anyone that  may not be feeling well and notify your surgeon if you develop symptoms. If you test positive for Covid or have been in contact with anyone that has tested positive in the last 10 days please notify you surgeon.  [place] - Preparing for Surgery Before surgery, you can play an important role.  Because skin is not sterile, your skin needs to be as free of germs as possible.  You can reduce the number of germs on your skin by washing with CHG (chlorahexidine gluconate) soap before surgery.  CHG is an antiseptic cleaner which kills germs and bonds with the skin to continue killing germs even after washing. Please DO NOT use if you have an allergy to CHG or antibacterial soaps.  If your skin becomes reddened/irritated stop using the CHG and inform your nurse when you arrive at Short Stay. Do not shave (including legs and underarms) for at least 48 hours prior to the first CHG shower.  You may shave your face/neck.  Please follow these instructions carefully:  1.  Shower with CHG Soap the night before surgery and the  morning of surgery.  2.  If you choose to wash your hair, wash your hair first as usual with your normal  shampoo.  3.  After you shampoo, rinse your hair and body thoroughly to remove the shampoo.                             4.  Use CHG as you would any other liquid soap.  You can apply chg directly to the  skin and wash.  Gently with a scrungie or clean washcloth.  5.  Apply the CHG Soap to your body ONLY FROM THE NECK DOWN.   Do   not use on face/ open                           Wound or open sores. Avoid contact with eyes, ears mouth and   genitals (private parts).                       Wash face,  Genitals (private parts) with your normal soap.             6.  Wash thoroughly, paying special attention to the area where your    surgery  will be performed.  7.  Thoroughly rinse your body with warm water from the neck down.  8.  DO NOT shower/wash with your normal soap after using and  rinsing off the CHG Soap.                9.  Pat yourself dry with a clean towel.            10.  Wear clean pajamas.            11.  Place clean sheets on your bed the night of your first shower and do not  sleep with pets. Day of Surgery : Do not apply any lotions/deodorants the morning of surgery.  Please wear clean clothes to the hospital/surgery center.  FAILURE TO FOLLOW THESE INSTRUCTIONS MAY RESULT IN THE CANCELLATION OF YOUR SURGERY  PATIENT SIGNATURE_________________________________  NURSE SIGNATURE__________________________________  ________________________________________________________________________    Rogelia Mire  An incentive spirometer is a tool that can help keep your lungs clear and active. This tool measures how well you are filling your lungs with each breath. Taking long deep breaths may help reverse or decrease the chance of developing breathing (pulmonary) problems (especially infection) following: A long period of time when you are unable to move or be active. BEFORE THE PROCEDURE  If the spirometer includes an indicator to show your best effort, your nurse or respiratory therapist will set it to a desired goal. If possible, sit up straight or lean slightly forward. Try not to slouch. Hold the incentive spirometer in an upright position. INSTRUCTIONS FOR USE  Sit on the edge of your bed if possible, or sit up as far as you can in bed or on a chair. Hold the incentive spirometer in an upright position. Breathe out normally. Place the mouthpiece in your mouth and seal your lips tightly around it. Breathe in slowly and as deeply as possible, raising the piston or the ball toward the top of the column. Hold your breath for 3-5 seconds or for as long as possible. Allow the piston or ball to fall to the bottom of the column. Remove the mouthpiece from your mouth and breathe out normally. Rest for a few seconds and repeat Steps 1 through 7 at least 10  times every 1-2 hours when you are awake. Take your time and take a few normal breaths between deep breaths. The spirometer may include an indicator to show your best effort. Use the indicator as a goal to work toward during each repetition. After each set of 10 deep breaths, practice coughing to be sure your lungs are clear. If you have an incision (the cut made at the time of surgery), support  your incision when coughing by placing a pillow or rolled up towels firmly against it. Once you are able to get out of bed, walk around indoors and cough well. You may stop using the incentive spirometer when instructed by your caregiver.  RISKS AND COMPLICATIONS Take your time so you do not get dizzy or light-headed. If you are in pain, you may need to take or ask for pain medication before doing incentive spirometry. It is harder to take a deep breath if you are having pain. AFTER USE Rest and breathe slowly and easily. It can be helpful to keep track of a log of your progress. Your caregiver can provide you with a simple table to help with this. If you are using the spirometer at home, follow these instructions: SEEK MEDICAL CARE IF:  You are having difficultly using the spirometer. You have trouble using the spirometer as often as instructed. Your pain medication is not giving enough relief while using the spirometer. You develop fever of 100.5 F (38.1 C) or higher. SEEK IMMEDIATE MEDICAL CARE IF:  You cough up bloody sputum that had not been present before. You develop fever of 102 F (38.9 C) or greater. You develop worsening pain at or near the incision site. MAKE SURE YOU:  Understand these instructions. Will watch your condition. Will get help right away if you are not doing well or get worse. Document Released: 10/18/2006 Document Revised: 08/30/2011 Document Reviewed: 12/19/2006 [facility] Patient Information 2014 [place],  [place].   ________________________________________________________________________

## 2023-07-20 ENCOUNTER — Other Ambulatory Visit: Payer: Self-pay

## 2023-07-20 ENCOUNTER — Encounter (HOSPITAL_COMMUNITY)
Admission: RE | Admit: 2023-07-20 | Discharge: 2023-07-20 | Disposition: A | Discharge status: Home / Self Care | Payer: Medicaid Other | Source: Ambulatory Visit | Attending: Orthopedic Surgery | Admitting: Orthopedic Surgery

## 2023-07-20 ENCOUNTER — Encounter (HOSPITAL_COMMUNITY): Payer: Self-pay

## 2023-07-20 VITALS — BP 122/76 | HR 61 | Temp 98.5°F | Resp 16 | Ht 65.0 in | Wt 138.0 lb

## 2023-07-20 DIAGNOSIS — Z01818 Encounter for other preprocedural examination: Secondary | ICD-10-CM

## 2023-07-20 DIAGNOSIS — D649 Anemia, unspecified: Secondary | ICD-10-CM | POA: Diagnosis not present

## 2023-07-20 DIAGNOSIS — Z01812 Encounter for preprocedural laboratory examination: Secondary | ICD-10-CM | POA: Insufficient documentation

## 2023-07-20 HISTORY — DX: Unspecified osteoarthritis, unspecified site: M19.90

## 2023-07-20 HISTORY — DX: Anemia, unspecified: D64.9

## 2023-07-20 LAB — CBC
HCT: 36.9 % (ref 36.0–46.0)
Hemoglobin: 12.1 g/dL (ref 12.0–15.0)
MCH: 30.3 pg (ref 26.0–34.0)
MCHC: 32.8 g/dL (ref 30.0–36.0)
MCV: 92.5 fL (ref 80.0–100.0)
Platelets: UNDETERMINED 10*3/uL (ref 150–400)
RBC: 3.99 MIL/uL (ref 3.87–5.11)
RDW: 13.8 % (ref 11.5–15.5)
WBC: 5.6 10*3/uL (ref 4.0–10.5)
nRBC: 0 % (ref 0.0–0.2)

## 2023-07-20 NOTE — Progress Notes (Signed)
COVID Vaccine Completed:  Date of COVID positive in last 90 days:  No  PCP - Kathreen Cornfield, PA-C Cardiologist - N/A  Chest x-ray - N/A EKG - N/A Stress Test - N/A ECHO - N/A Cardiac Cath - N/A Pacemaker/ICD device last checked: Spinal Cord Stimulator: N/A  Bowel Prep - N/A  Sleep Study - N/A CPAP -   Fasting Blood Sugar - N/A Checks Blood Sugar _____ times a day  Last dose of GLP1 agonist-  N/A GLP1 instructions:  Hold 7 days before surgery    Last dose of SGLT-2 inhibitors-  N/A SGLT-2 instructions:  Hold 3 days before surgery   Blood Thinner Instructions:  N/A Aspirin Instructions: Last Dose:  Activity level:  Can go up a flight of stairs and perform activities of daily living without stopping and without symptoms of chest pain or shortness of breath.  Anesthesia review:  N/A  Patient denies shortness of breath, fever, cough and chest pain at PAT appointment  Patient verbalized understanding of instructions that were given to them at the PAT appointment. Patient was also instructed that they will need to review over the PAT instructions again at home before surgery.

## 2023-07-21 ENCOUNTER — Encounter: Payer: Medicaid Other | Admitting: Obstetrics and Gynecology

## 2023-07-28 ENCOUNTER — Ambulatory Visit (HOSPITAL_COMMUNITY)
Admission: RE | Admit: 2023-07-28 | Discharge: 2023-07-28 | Disposition: A | Discharge status: Home / Self Care | Payer: Medicaid Other | Attending: Orthopedic Surgery | Admitting: Orthopedic Surgery

## 2023-07-28 ENCOUNTER — Ambulatory Visit (HOSPITAL_COMMUNITY): Payer: Self-pay | Admitting: Anesthesiology

## 2023-07-28 ENCOUNTER — Encounter (HOSPITAL_COMMUNITY): Payer: Self-pay | Admitting: Orthopedic Surgery

## 2023-07-28 ENCOUNTER — Encounter (HOSPITAL_COMMUNITY)
Admission: RE | Disposition: A | Discharge status: Home / Self Care | Payer: Self-pay | Source: Home / Self Care | Attending: Orthopedic Surgery

## 2023-07-28 DIAGNOSIS — M75111 Incomplete rotator cuff tear or rupture of right shoulder, not specified as traumatic: Secondary | ICD-10-CM | POA: Insufficient documentation

## 2023-07-28 DIAGNOSIS — M75101 Unspecified rotator cuff tear or rupture of right shoulder, not specified as traumatic: Secondary | ICD-10-CM

## 2023-07-28 DIAGNOSIS — M19011 Primary osteoarthritis, right shoulder: Secondary | ICD-10-CM | POA: Insufficient documentation

## 2023-07-28 DIAGNOSIS — M7541 Impingement syndrome of right shoulder: Secondary | ICD-10-CM | POA: Insufficient documentation

## 2023-07-28 DIAGNOSIS — F17210 Nicotine dependence, cigarettes, uncomplicated: Secondary | ICD-10-CM | POA: Insufficient documentation

## 2023-07-28 DIAGNOSIS — Z01818 Encounter for other preprocedural examination: Secondary | ICD-10-CM

## 2023-07-28 DIAGNOSIS — E119 Type 2 diabetes mellitus without complications: Secondary | ICD-10-CM | POA: Insufficient documentation

## 2023-07-28 HISTORY — PX: SHOULDER ARTHROSCOPY WITH ROTATOR CUFF REPAIR: SHX5685

## 2023-07-28 LAB — POCT PREGNANCY, URINE: Preg Test, Ur: NEGATIVE

## 2023-07-28 SURGERY — ARTHROSCOPY, SHOULDER, WITH ROTATOR CUFF REPAIR
Anesthesia: Regional | Site: Shoulder | Laterality: Right

## 2023-07-28 MED ORDER — ACETAMINOPHEN 160 MG/5ML PO SOLN: 325.0000 mg | ORAL | Status: DC | PRN

## 2023-07-28 MED ORDER — SUGAMMADEX SODIUM 200 MG/2ML IV SOLN
INTRAVENOUS | Status: DC | PRN
  Administered 2023-07-28: 200 mg via INTRAVENOUS

## 2023-07-28 MED ORDER — FENTANYL CITRATE (PF) 100 MCG/2ML IJ SOLN
INTRAMUSCULAR | Status: DC | PRN
  Administered 2023-07-28: 25 ug via INTRAVENOUS

## 2023-07-28 MED ORDER — SODIUM CHLORIDE 0.9 % IR SOLN
Status: DC | PRN
  Administered 2023-07-28 (×2): 3000 mL
  Administered 2023-07-28: 6000 mL

## 2023-07-28 MED ORDER — LIDOCAINE HCL (PF) 2 % IJ SOLN
INTRAMUSCULAR | Status: AC
  Filled 2023-07-28: qty 5

## 2023-07-28 MED ORDER — FENTANYL CITRATE PF 50 MCG/ML IJ SOSY
25.0000 ug | PREFILLED_SYRINGE | INTRAMUSCULAR | Status: DC | PRN
Start: 2023-07-28 — End: 2023-07-28

## 2023-07-28 MED ORDER — ONDANSETRON HCL 4 MG/2ML IJ SOLN
INTRAMUSCULAR | Status: AC
  Filled 2023-07-28: qty 2

## 2023-07-28 MED ORDER — LACTATED RINGERS IV SOLN: INTRAVENOUS | Status: DC | PRN

## 2023-07-28 MED ORDER — ROCURONIUM BROMIDE 10 MG/ML (PF) SYRINGE
PREFILLED_SYRINGE | INTRAVENOUS | Status: DC | PRN
  Administered 2023-07-28: 50 mg via INTRAVENOUS

## 2023-07-28 MED ORDER — OXYCODONE HCL 5 MG PO TABS: 5.0000 mg | ORAL_TABLET | Freq: Once | ORAL | Status: DC | PRN

## 2023-07-28 MED ORDER — MEPERIDINE HCL 50 MG/ML IJ SOLN: 6.2500 mg | INTRAMUSCULAR | Status: DC | PRN

## 2023-07-28 MED ORDER — CELECOXIB 200 MG PO CAPS
200.0000 mg | ORAL_CAPSULE | Freq: Once | ORAL | Status: AC
  Administered 2023-07-28: 200 mg via ORAL
  Filled 2023-07-28: qty 1

## 2023-07-28 MED ORDER — CHLORHEXIDINE GLUCONATE 0.12 % MT SOLN
15.0000 mL | Freq: Once | OROMUCOSAL | Status: AC
  Administered 2023-07-28: 15 mL via OROMUCOSAL

## 2023-07-28 MED ORDER — DEXAMETHASONE SODIUM PHOSPHATE 10 MG/ML IJ SOLN
INTRAMUSCULAR | Status: DC | PRN
  Administered 2023-07-28: 10 mg via INTRAVENOUS

## 2023-07-28 MED ORDER — MIDAZOLAM HCL 2 MG/2ML IJ SOLN
1.0000 mg | INTRAMUSCULAR | Status: DC
  Administered 2023-07-28: 2 mg via INTRAVENOUS
  Filled 2023-07-28: qty 2

## 2023-07-28 MED ORDER — LIDOCAINE 2% (20 MG/ML) 5 ML SYRINGE
INTRAMUSCULAR | Status: DC | PRN
  Administered 2023-07-28: 100 mg via INTRAVENOUS

## 2023-07-28 MED ORDER — ORAL CARE MOUTH RINSE
15.0000 mL | Freq: Once | OROMUCOSAL | Status: AC
Start: 2023-07-28 — End: 2023-07-28

## 2023-07-28 MED ORDER — PROPOFOL 10 MG/ML IV BOLUS
INTRAVENOUS | Status: AC
  Filled 2023-07-28: qty 20

## 2023-07-28 MED ORDER — CEFAZOLIN SODIUM-DEXTROSE 2-4 GM/100ML-% IV SOLN
2.0000 g | INTRAVENOUS | Status: AC
  Administered 2023-07-28: 2 g via INTRAVENOUS
  Filled 2023-07-28: qty 100

## 2023-07-28 MED ORDER — CYCLOBENZAPRINE HCL 10 MG PO TABS: 10.0000 mg | ORAL_TABLET | Freq: Three times a day (TID) | ORAL | 1 refills | Status: DC | PRN

## 2023-07-28 MED ORDER — BUPIVACAINE LIPOSOME 1.3 % IJ SUSP
INTRAMUSCULAR | Status: DC | PRN
  Administered 2023-07-28: 10 mL via PERINEURAL

## 2023-07-28 MED ORDER — ACETAMINOPHEN 500 MG PO TABS
1000.0000 mg | ORAL_TABLET | Freq: Once | ORAL | Status: AC
  Administered 2023-07-28: 1000 mg via ORAL
  Filled 2023-07-28: qty 2

## 2023-07-28 MED ORDER — FENTANYL CITRATE PF 50 MCG/ML IJ SOSY
50.0000 ug | PREFILLED_SYRINGE | INTRAMUSCULAR | Status: DC
  Administered 2023-07-28: 100 ug via INTRAVENOUS
  Filled 2023-07-28: qty 2

## 2023-07-28 MED ORDER — FENTANYL CITRATE (PF) 100 MCG/2ML IJ SOLN
INTRAMUSCULAR | Status: AC
  Filled 2023-07-28: qty 2

## 2023-07-28 MED ORDER — OXYCODONE HCL 5 MG/5ML PO SOLN: 5.0000 mg | Freq: Once | ORAL | Status: DC | PRN

## 2023-07-28 MED ORDER — OXYCODONE-ACETAMINOPHEN 5-325 MG PO TABS: 1.0000 | ORAL_TABLET | ORAL | 0 refills | Status: DC | PRN

## 2023-07-28 MED ORDER — ONDANSETRON HCL 4 MG/2ML IJ SOLN: 4.0000 mg | Freq: Once | INTRAMUSCULAR | Status: DC | PRN

## 2023-07-28 MED ORDER — ONDANSETRON HCL 4 MG PO TABS: 4.0000 mg | ORAL_TABLET | Freq: Three times a day (TID) | ORAL | 0 refills | Status: DC | PRN

## 2023-07-28 MED ORDER — ACETAMINOPHEN 325 MG PO TABS: 325.0000 mg | ORAL_TABLET | ORAL | Status: DC | PRN

## 2023-07-28 MED ORDER — IBUPROFEN 800 MG PO TABS: 800.0000 mg | ORAL_TABLET | Freq: Four times a day (QID) | ORAL | 1 refills | Status: DC | PRN

## 2023-07-28 MED ORDER — PROPOFOL 10 MG/ML IV BOLUS
INTRAVENOUS | Status: DC | PRN
  Administered 2023-07-28: 200 mg via INTRAVENOUS

## 2023-07-28 MED ORDER — BUPIVACAINE HCL (PF) 0.5 % IJ SOLN
INTRAMUSCULAR | Status: DC | PRN
  Administered 2023-07-28: 15 mL via PERINEURAL

## 2023-07-28 MED ORDER — DEXAMETHASONE SODIUM PHOSPHATE 10 MG/ML IJ SOLN
INTRAMUSCULAR | Status: AC
  Filled 2023-07-28: qty 1

## 2023-07-28 MED ORDER — ONDANSETRON HCL 4 MG/2ML IJ SOLN
INTRAMUSCULAR | Status: DC | PRN
  Administered 2023-07-28: 4 mg via INTRAVENOUS

## 2023-07-28 SURGICAL SUPPLY — 52 items
ANCHOR PEEK 4.75X19.1 SWLK C (Anchor) IMPLANT
BAG COUNTER SPONGE SURGICOUNT (BAG) ×1 IMPLANT
BLADE EXCALIBUR 4.0X13 (MISCELLANEOUS) ×1 IMPLANT
BOOTIES KNEE HIGH SLOAN (MISCELLANEOUS) ×2 IMPLANT
BURR OVAL 8 FLU 4.0X13 (MISCELLANEOUS) ×1 IMPLANT
CANNULA ACUFLEX KIT 5X76 (CANNULA) ×1 IMPLANT
CANNULA DRILOCK 5.0X75 (CANNULA) IMPLANT
CANNULA TWIST IN 8.25X7CM (CANNULA) IMPLANT
CONNECTOR 5 IN 1 STRAIGHT STRL (MISCELLANEOUS) ×1 IMPLANT
COOLER ICEMAN CLASSIC (MISCELLANEOUS) IMPLANT
DISSECTOR 3.8MM X 13CM (MISCELLANEOUS) ×1 IMPLANT
DRAPE INCISE 23X17 STRL (DRAPES) ×1 IMPLANT
DRAPE INCISE IOBAN 23X17 STRL (DRAPES) ×1
DRAPE INCISE IOBAN 66X45 STRL (DRAPES) ×1 IMPLANT
DRAPE STERI 35X30 U-POUCH (DRAPES) ×1 IMPLANT
DRAPE SURG 17X11 SM STRL (DRAPES) ×1 IMPLANT
DRAPE SURG ORHT 6 SPLT 77X108 (DRAPES) ×2 IMPLANT
DRAPE U-SHAPE 47X51 STRL (DRAPES) ×1 IMPLANT
DURAPREP 26ML APPLICATOR (WOUND CARE) ×1 IMPLANT
FIBER TAPE 2MM (SUTURE) IMPLANT
GAUZE PAD ABD 8X10 STRL (GAUZE/BANDAGES/DRESSINGS) ×2 IMPLANT
GAUZE SPONGE 4X4 12PLY STRL (GAUZE/BANDAGES/DRESSINGS) ×1 IMPLANT
GLOVE BIO SURGEON STRL SZ7.5 (GLOVE) ×1 IMPLANT
GLOVE BIO SURGEON STRL SZ8 (GLOVE) ×1 IMPLANT
GLOVE SS BIOGEL STRL SZ 7 (GLOVE) ×1 IMPLANT
GLOVE SS BIOGEL STRL SZ 7.5 (GLOVE) ×1 IMPLANT
GOWN STRL SURGICAL XL XLNG (GOWN DISPOSABLE) ×2 IMPLANT
KIT BASIN OR (CUSTOM PROCEDURE TRAY) ×1 IMPLANT
KIT SHOULDER TRACTION (DRAPES) ×1 IMPLANT
KIT TURNOVER KIT A (KITS) IMPLANT
MANIFOLD NEPTUNE II (INSTRUMENTS) ×1 IMPLANT
NDL HD SCORPION MEGA LOADER (NEEDLE) IMPLANT
NS IRRIG 1000ML POUR BTL (IV SOLUTION) ×1 IMPLANT
PACK ARTHROSCOPY WL (CUSTOM PROCEDURE TRAY) ×1 IMPLANT
PAD ARMBOARD 7.5X6 YLW CONV (MISCELLANEOUS) ×1 IMPLANT
PAD COLD SHLDR WRAP-ON (PAD) IMPLANT
SLING ARM FOAM STRAP MED (SOFTGOODS) IMPLANT
SLING ULTRA III MED (ORTHOPEDIC SUPPLIES) IMPLANT
SPONGE T-LAP 4X18 ~~LOC~~+RFID (SPONGE) ×1 IMPLANT
STRIP CLOSURE SKIN 1/2X4 (GAUZE/BANDAGES/DRESSINGS) ×1 IMPLANT
SUT FIBERWIRE #2 38 T-5 BLUE (SUTURE)
SUT MNCRL AB 3-0 PS2 18 (SUTURE) ×1 IMPLANT
SUT PDS AB 0 CT 36 (SUTURE) IMPLANT
SUT TIGER TAPE 7 IN WHITE (SUTURE) ×1 IMPLANT
SUTURE FIBERWR #2 38 T-5 BLUE (SUTURE) IMPLANT
TAPE FIBER 2MM 7IN #2 BLUE (SUTURE) IMPLANT
TAPE PAPER 3X10 WHT MICROPORE (GAUZE/BANDAGES/DRESSINGS) ×1 IMPLANT
TOWEL OR 17X26 10 PK STRL BLUE (TOWEL DISPOSABLE) ×1 IMPLANT
TUBING ARTHROSCOPY IRRIG 16FT (MISCELLANEOUS) ×1 IMPLANT
WAND ABLATOR APOLLO I90 (BUR) ×1 IMPLANT
WATER STERILE IRR 1000ML POUR (IV SOLUTION) ×1 IMPLANT
YANKAUER SUCT BULB TIP 10FT TU (MISCELLANEOUS) ×1 IMPLANT

## 2023-07-28 NOTE — Anesthesia Procedure Notes (Signed)
 Procedure Name: Intubation Date/Time: 07/28/2023 2:16 PM  Performed by: Therisa Doyal CROME, CRNAPre-anesthesia Checklist: Patient identified, Emergency Drugs available, Suction available and Patient being monitored Patient Re-evaluated:Patient Re-evaluated prior to induction Oxygen Delivery Method: Circle system utilized Preoxygenation: Pre-oxygenation with 100% oxygen Induction Type: IV induction Ventilation: Mask ventilation without difficulty Laryngoscope Size: Miller and 2 Grade View: Grade I Tube type: Oral Tube size: 7.0 mm Number of attempts: 1 Airway Equipment and Method: Stylet and Oral airway Placement Confirmation: ETT inserted through vocal cords under direct vision, positive ETCO2 and breath sounds checked- equal and bilateral Secured at: 20 cm Tube secured with: Tape Dental Injury: Teeth and Oropharynx as per pre-operative assessment

## 2023-07-28 NOTE — Transfer of Care (Signed)
 Immediate Anesthesia Transfer of Care Note  Patient: [name]  Procedure(s) Performed: Right Shoulder Arthroscopy , Debridement, Sub-Acromial decompression, Distal Clavicle resection, rotator cuff repair (Right: Shoulder)  Patient Location: PACU  Anesthesia Type:General  Level of Consciousness: sedated, patient cooperative, and responds to stimulation  Airway & Oxygen Therapy: Patient Spontanous Breathing and Patient connected to face mask oxygen  Post-op Assessment: Report given to RN and Post -op Vital signs reviewed and stable  Post vital signs: Reviewed and stable  Last Vitals:  Vitals Value Taken Time  BP 124/75 07/28/23 1549  Temp 36.5 C 07/28/23 1549  Pulse 85 07/28/23 1551  Resp 15 07/28/23 1551  SpO2 99 % 07/28/23 1551  Vitals shown include unfiled device data.  Last Pain:  Vitals:   07/28/23 1405  TempSrc:   PainSc: 0-No pain      Patients Stated Pain Goal: 5 (07/28/23 1218)  Complications: No notable events documented.

## 2023-07-28 NOTE — H&P (Signed)
 [name]    Chief Complaint: Right Shoulder impingement, Acromio clavicular osteoarthritis, partial rotator cuff tear HPI: The patient is a 38 y.o. female with chronic and progressive increasing right shoulder pain related to impingement syndrome and symptomatic AC joint arthritis.  Recent imaging studies show evidence for rotator cuff degeneration and partial tearing intrasubstance.  Due to her ongoing pain and failure to respond to prolonged attempts of conservative management, she is brought to the operating this time for planned right shoulder arthroscopy.  Past Medical History:  Diagnosis Date   Anemia    Arthritis    Injury of right rotator cuff       Past Surgical History:  Procedure Laterality Date   TUBAL LIGATION Bilateral 02/14/2016   Procedure: POST PARTUM TUBAL LIGATION;  Surgeon: Lynwood KANDICE Solomons, MD;  Location: [facility];  Service: Gynecology;  Laterality: Bilateral;    Family History  Problem Relation Age of Onset   Cancer Other     Social History:  reports that she has been smoking cigarettes. She has never used smokeless tobacco. She reports current alcohol use. She reports that she does not currently use drugs after having used the following drugs: Marijuana.  BMI: There is no height or weight on file to calculate BMI.    Diabetes: Patient does not have a diagnosis of diabetes.     Smoking Status: Social History   Tobacco Use  Smoking Status Every Day   Current packs/day: 0.00   Types: Cigarettes   Last attempt to quit: 05/06/2015   Years since quitting: 8.2  Smokeless Tobacco Never   Ready to quit: Not Answered Counseling given: Not Answered  The patient has participated in a 4-week cessation program.           No medications prior to admission.     Physical Exam: Right shoulder demonstrates functional motion but with obvious pain during overhead elevation.  She continues to have positive impingement sign.  Negative drop arm.  Locally  tender palpation over the [facility] joint.  Examination otherwise as documented at her recent office visit.  Plain films of the right shoulder confirm moderate AC joint arthritis.  Recent MRI scan shows evidence for bony impingement and rotator cuff tendinosis with some intrasubstance degenerative change.  Vitals     Assessment/Plan  Impression: Right Shoulder impingement, Acromio clavicular osteoarthritis, partial rotator cuff tear  Plan of Action: Procedure(s): Right Shoulder Arthroscopy , Debridement, Sub-Acromial decompression, Distal Clavicle resection, possible rotator cuff repair  Jacques Willingham M Reeva Davern 07/28/2023, 6:51 AM Contact # [phone]

## 2023-07-28 NOTE — Discharge Instructions (Signed)
   Kevin M. Supple, M.D., F.A.A.O.S. Orthopaedic Surgery Specializing in Arthroscopic and Reconstructive Surgery of the Shoulder [phone] [address] - [place], [place] [address] - Fax [phone]  POST-OP SHOULDER ARTHROSCOPIC ROTATOR CUFF  REPAIR INSTRUCTIONS  1. Call the office at [phone] to schedule your first post-op appointment 7-10 days from the date of your surgery.  2. Leave the steri-strips in place over your incisions when performing dressing changes and showering. You may remove your dressings and begin showering 72 hours from surgery. You can expect drainage that is clear to bloody in nature that occasionally will soak through your dressings. If this occurs go ahead and perform a dressing change. The drainage should lessen daily and when there is no drainage from your incisions feel free to go without a dressing.  3. Wear your sling/immobilizer at all times except to perform the exercises below or to occasionally let your arm dangle by your side to stretch your elbow. You also need to sleep in your sling immobilizer until instructed otherwise.  4. Range of motion to your elbow, wrist, and hand are encouraged 3-5 times daily. Exercise to your hand and fingers helps to reduce swelling you may experience.  5. Utilize ice to the shoulder 3-4 times minimum a day and additionally if you are experiencing pain.  6. You may one-armed drive when safely off of narcotics and muscle relaxants. You may use your hand that is in the sling to support the steering wheel only. However, should it be your right arm that is in the sling it is not to be used for gear shifting in a manual transmission.  7. Pain control following an exparel block  To help control your post-operative pain you received a nerve block  performed with Exparel which is a long acting anesthetic (numbing agent) which can provide pain relief and sensations of numbness (and relief of pain) in the operative  shoulder and arm for up to 3 days. Sometimes it provides mixed relief, meaning you may still have numbness in certain areas of the arm but can still be able to move  parts of that arm, hand, and fingers. We recommend that your prescribed pain medications  be used as needed. We do not feel it is necessary to "pre medicate" and "stay ahead" of pain.  Taking narcotic pain medications when you are not having any pain can lead to unnecessary and potentially dangerous side effects.    8. Pain medications can produce constipation along with their use. If you experience this, the use of an over the counter stool softener or laxative daily is recommended.   9. For additional questions or concerns, please do not hesitate to call the office. If after hours there is an answering service to forward your concerns to the physician on call.  POST-OP EXERCISES  Pendulum Exercises  Perform pendulum exercises while standing and bending at the waist. Support your uninvolved arm on a table or chair and allow your operated arm to hang freely. Make sure to do these exercises passively - not using you shoulder muscle.  Repeat 20 times. Do 3 sessions per day.   

## 2023-07-28 NOTE — Anesthesia Procedure Notes (Signed)
 Anesthesia Regional Block: Interscalene brachial plexus block   Pre-Anesthetic Checklist: , timeout performed,  Correct Patient, Correct Site, Correct Laterality,  Correct Procedure, Correct Position, site marked,  Risks and benefits discussed,  Surgical consent,  Pre-op evaluation,  At surgeon's request and post-op pain management  Laterality: Right  Prep: chloraprep       Needles:  Injection technique: Single-shot  Needle Type: Echogenic Stimulator Needle     Needle Length: 5cm  Needle Gauge: 22     Additional Needles:   Procedures:, nerve stimulator,,, ultrasound used (permanent image in chart),,     Nerve Stimulator or Paresthesia:  Response: hand, 0.45 mA  Additional Responses:   Narrative:  Start time: 07/28/2023 1:45 PM End time: 07/28/2023 1:50 PM Injection made incrementally with aspirations every 5 mL.  Performed by: Personally  Anesthesiologist: Mallory Manus, MD  Additional Notes: Functioning IV was confirmed and monitors were applied.  A 50mm 22ga Arrow echogenic stimulator needle was used. Sterile prep and drape,hand hygiene and sterile gloves were used. Ultrasound guidance: relevant anatomy identified, needle position confirmed, local anesthetic spread visualized around nerve(s)., vascular puncture avoided.  Image printed for medical record. Negative aspiration and negative test dose prior to incremental administration of local anesthetic. The patient tolerated the procedure well.

## 2023-07-28 NOTE — Op Note (Signed)
 07/28/2023  3:30 PM  PATIENT:   [name]  38 y.o. female  PRE-OPERATIVE DIAGNOSIS:  Right Shoulder impingement, Acromio clavicular osteoarthritis, partial rotator cuff tear  POST-OPERATIVE DIAGNOSIS: Same  PROCEDURE:  1.  Right shoulder examination under anesthesia  2.  Right shoulder glenohumeral joint diagnostic arthroscopy  3.  Arthroscopic subacromial decompression and bursectomy  4.  Arthroscopic distal clavicle resection  5.  Arthroscopic rotator cuff repair of a high-grade partial rotator cuff tear using a double row suture bridge repair construct  SURGEON:  Keary Hanak, Franky BATTLE M.D.  ASSISTANTS: Randine Ricks, PA-C  Randine Ricks, PA-C was utilized as an geophysicist/field seismologist throughout this case, essential for help with positioning the patient, positioning extremity, tissue manipulation, implantation of the prosthesis, suture management, wound closure, and intraoperative decision-making.   ANESTHESIA:   General Endotracheal and interscalene block with Exparel   EBL: Minimal  SPECIMEN: None  Drains: None   PATIENT DISPOSITION:  PACU - hemodynamically stable.    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  She has had a recent MRI scan demonstrating significant intrasubstance degeneration with partial tendon tearing.  Due to her ongoing pain and functional limitations and failure to respond to prolonged attempts of conservative management, she is brought to the operating today for planned right shoulder arthroscopy as described below.  Preoperatively the patient was counseled regarding treatment options as well as the potential risks versus benefits thereof.  Possible surgical complications were reviewed including the potential for bleeding, infection, neurovascular injury, persistence of pain,  loss of motion, anesthetic complication, recurrence of rotator cuff tear, and possible need for additional surgery.  She understands, and accepts, and agrees with our planned procedure.  Procedure detail:  After undergoing routine preop evaluation the patient received prophylactic antibiotics and interscalene block with Exparel  was established in the holding area by the anesthesia department.  Subsequently placed spine on the operating table and underwent the smooth induction of a general endotracheal anesthesia.  Turned to the left lateral decubitus position on the beanbag and appropriately padded and protected.  Right shoulder examination under anesthesia revealed full motion.  No instability patterns noted.  Right arm was then suspended at 70 degrees of abduction with 10 pounds of traction in the right shoulder girdle region was sterilely prepped and draped in standard fashion.  Timeout was called.  A posterior portal established into the glenohumeral joint and diagnostic arthroscopy was performed.  Articular surfaces were in pristine condition.  The rotator cuff was carefully inspected and found to be intact and pristine throughout.  Biceps tendon showed normal caliber with no evidence for proximal or distal instability.  Capsular volume was within normal limits.  At this point fluid and instruments were then removed from the glenohumeral joint.  Arm was dropped down to 30 degrees of abduction with the arthroscope introduced in the subacromial space of the posterior portal and a direct lateral portal established in the subacromial space.  Abundant dense bursal tissue and multiple adhesions were encountered and these were all divided and excised with a combination of the shaver and the Stryker wand.  The wand was then used to remove the periosteum from the undersurface of the anterior half of the acromion and a subacromial decompression was performed with a bur crating a type I morphology.  A portal was then  established directly  anterior to the distal clavicle and the distal clavicle resection was performed with a bur crating a type I morphology.  A portal was then established directly anterior to the distal clavicle and the distal clavicle resection was performed with a bur with care taken [facility] visualization of the entire circumference of the distal clavicle to ensure adequate removal of bone.  We then completed the subacromial/subdeltoid bursectomy.  The bursal surface of the rotator cuff was carefully inspected and probed we did indeed find a significant high-grade partial bursal surface tear constituting approximate 80% of the tendon thickness.  The torn tendon was debrided back to healthy tissue and the tear was completed ultimately having a footprint approximate 1.5 cm in width.  The tendon was trimmed back to healthy tissue and the tuberosity was then prepared removing soft tissue and gently abrading the bone to bleeding bed.  Through a stab wound of the lateral margin of the acromion with placed an Arthrex peek swivel lock suture anchor loaded with a single suture tape.  All 4 suture limbs were then passed equidistant across the width of the rotator cuff tear using the scorpion suture passer.  The suture limbs were then passed in an alternating fashion into 2 lateral row anchors crating a double row repair which nicely compressed the free margin of the rotator cuff tendon against the bony bed of the tuberosity and the overall construct was much to our satisfaction.  Suture limbs were all clipped.  Fluid and instruments were removed.  The portals were closed with Monocryl and a Steri-Strip.  A dry dressing taped about the right shoulder right and was placed in a sling immobilizer with abduction pillow and the patient was awakened, extubated, and taken to the recovery room in stable condition.  Franky CHRISTELLA Pointer MD   Contact # [phone]

## 2023-07-28 NOTE — Anesthesia Preprocedure Evaluation (Signed)
 Anesthesia Evaluation  Patient identified by MRN, date of birth, ID band Patient awake    Reviewed: Allergy & Precautions, H&P , Patient's Chart, lab work & pertinent test results  Airway Mallampati: III       Dental  (+) Teeth Intact   Pulmonary neg pulmonary ROS, Current Smoker and Patient abstained from smoking.   breath sounds clear to auscultation       Cardiovascular Exercise Tolerance: Good negative cardio ROS  Rhythm:Regular Rate:Normal     Neuro/Psych negative neurological ROS  negative psych ROS   GI/Hepatic negative GI ROS, Neg liver ROS,,,  Endo/Other  negative endocrine ROS    Renal/GU negative Renal ROS  negative genitourinary   Musculoskeletal  (+) Arthritis ,    Abdominal   Peds negative pediatric ROS (+)  Hematology  (+) Blood dyscrasia, anemia   Anesthesia Other Findings   Reproductive/Obstetrics negative OB ROS                                Anesthesia Physical Anesthesia Plan  ASA: 2  Anesthesia Plan: General and Regional   Post-op Pain Management: Regional block*, Tylenol  PO (pre-op)* and Celebrex  PO (pre-op)*   Induction: Intravenous  PONV Risk Score and Plan: 3 and Ondansetron , Dexamethasone  and Treatment may vary due to age or medical condition  Airway Management Planned: Oral ETT  Additional Equipment: None  Intra-op Plan:   Post-operative Plan: Extubation in OR  Informed Consent: I have reviewed the patients History and Physical, chart, labs and discussed the procedure including the risks, benefits and alternatives for the proposed anesthesia with the patient or authorized representative who has indicated his/her understanding and acceptance.     Dental advisory given  Plan Discussed with: Anesthesiologist  Anesthesia Plan Comments: (Discussed both nerve block for pain relief post-op and GA; including NV, sore throat, dental injury, and  pulmonary complications)        Anesthesia Quick Evaluation

## 2023-07-29 ENCOUNTER — Encounter (HOSPITAL_COMMUNITY): Payer: Self-pay | Admitting: Orthopedic Surgery

## 2023-07-29 NOTE — Anesthesia Postprocedure Evaluation (Signed)
 Anesthesia Post Note  Patient: [name]  Procedure(s) Performed: Right Shoulder Arthroscopy , Debridement, Sub-Acromial decompression, Distal Clavicle resection, rotator cuff repair (Right: Shoulder)     Patient location during evaluation: PACU Anesthesia Type: Regional and General Level of consciousness: awake and alert Pain management: pain level controlled Vital Signs Assessment: post-procedure vital signs reviewed and stable Respiratory status: spontaneous breathing, nonlabored ventilation, respiratory function stable and patient connected to nasal cannula oxygen Cardiovascular status: blood pressure returned to baseline and stable Postop Assessment: no apparent nausea or vomiting Anesthetic complications: no   No notable events documented.  Last Vitals:  Vitals:   07/28/23 1630 07/28/23 1635  BP: 121/81 135/68  Pulse: 65 (!) 59  Resp: 19 18  Temp: 36.4 C   SpO2: 98% 97%    Last Pain:  Vitals:   07/28/23 1635  TempSrc:   PainSc: 0-No pain                 Clearence Vitug

## 2023-07-31 ENCOUNTER — Emergency Department (HOSPITAL_COMMUNITY)
Admission: EM | Admit: 2023-07-31 | Discharge: 2023-07-31 | Disposition: A | Discharge status: Home / Self Care | Payer: Medicaid Other | Attending: Emergency Medicine | Admitting: Emergency Medicine

## 2023-07-31 ENCOUNTER — Encounter (HOSPITAL_COMMUNITY): Payer: Self-pay

## 2023-07-31 ENCOUNTER — Other Ambulatory Visit: Payer: Self-pay

## 2023-07-31 DIAGNOSIS — Z91040 Latex allergy status: Secondary | ICD-10-CM | POA: Insufficient documentation

## 2023-07-31 DIAGNOSIS — Z4801 Encounter for change or removal of surgical wound dressing: Secondary | ICD-10-CM | POA: Diagnosis present

## 2023-07-31 NOTE — ED Provider Notes (Signed)
 [facility] EMERGENCY DEPARTMENT AT [facility] Provider Note   CSN: [id number] Arrival date & time: 07/31/23  1227     History  Chief Complaint  Patient presents with   Dressing Change    [name] is a 38 y.o. female.  Who is status post right rotator cuff repair on February 6 presenting for dressing change.  She underwent rotator cuff repair with Dr. Melita here on February 6.  Has had her original dressing on since that time.  Has been in a sling and tolerating discomfort while at home.  No fevers chills or other problems.  HPI     Home Medications Prior to Admission medications   Medication Sig Start Date End Date Taking? Authorizing Provider  cyclobenzaprine  (FLEXERIL ) 10 MG tablet Take 1 tablet (10 mg total) by mouth 3 (three) times daily as needed for muscle spasms. 07/28/23  Yes Shuford, Randine, PA-C  ibuprofen  (ADVIL ) 800 MG tablet Take 1 tablet (800 mg total) by mouth every 6 (six) hours as needed (pain.). 07/28/23  Yes Shuford, Randine, PA-C  ondansetron  (ZOFRAN ) 4 MG tablet Take 1 tablet (4 mg total) by mouth every 8 (eight) hours as needed for nausea or vomiting. 07/28/23  Yes Shuford, Randine, PA-C  oxyCODONE -acetaminophen  (PERCOCET) 5-325 MG tablet Take 1 tablet by mouth every 4 (four) hours as needed (max 6 q). Patient taking differently: Take 1 tablet by mouth every 4 (four) hours as needed (for pain- max of 6 tablets/24 hours). 07/28/23  Yes Shuford, Randine, PA-C      Allergies    Latex    Review of Systems   Review of Systems  Physical Exam Updated Vital Signs BP 128/80 (BP Location: Left Arm)   Pulse (!) 59   Temp 98.4 F (36.9 C) (Oral)   Resp 16   Ht 5' 4 (1.626 m)   Wt 66.2 kg   SpO2 98%   BMI 25.06 kg/m  Physical Exam Vitals and nursing note reviewed.  HENT:     Head: Normocephalic and atraumatic.  Eyes:     Pupils: Pupils are equal, round, and reactive to light.  Cardiovascular:     Rate and Rhythm: Normal rate and regular rhythm.   Pulmonary:     Effort: Pulmonary effort is normal.     Breath sounds: Normal breath sounds.  Abdominal:     Palpations: Abdomen is soft.     Tenderness: There is no abdominal tenderness.  Musculoskeletal:     Comments: Right upper extremity in sling Four healing arthroscopic surgical incisions without drainage or increased erythema Good motor strength in the hand with sensation to light touch intact throughout right upper extremity  Skin:    General: Skin is warm and dry.  Neurological:     Mental Status: She is alert.  Psychiatric:        Mood and Affect: Mood normal.     ED Results / Procedures / Treatments   Labs (all labs ordered are listed, but only abnormal results are displayed) Labs Reviewed - No data to display  EKG None  Radiology No results found.  Procedures Procedures    Medications Ordered in ED Medications - No data to display  ED Course/ Medical Decision Making/ A&P                                 Medical Decision Making 38 year old female status post rotator cuff repair 3  days ago here for dressing change.  Arthroscopic sites look intact and are healing well.  Took down the dressing and replaced the bulky dressing with Steri-Strips over each of the 4 sites and cover with gauze for some protection.  She will follow-up with her orthopedic team.  Instructed her on proper wound care management moving forward           Final Clinical Impression(s) / ED Diagnoses Final diagnoses:  Dressing change or removal, surgical wound    Rx / DC Orders ED Discharge Orders     None         Pamella Ozell LABOR, DO 07/31/23 1611

## 2023-07-31 NOTE — Discharge Instructions (Signed)
 Change your wound dressing after your right rotator cuff surgery The incision sites look very good Keep this clean and dry You can shower and use soap It is okay if the Steri-Strips fall off but you can replace them on your own Follow-up with your orthopedic team as scheduled

## 2023-07-31 NOTE — ED Triage Notes (Signed)
 Rotator cuff surgery on Thursday. Pt was told to change dressing today, but was give no supplies to change dressing. Denies fever, chills, body aches. Denies abnormal drainage or issues with incision site.

## 2023-09-05 ENCOUNTER — Encounter: Payer: Medicaid Other | Admitting: Obstetrics and Gynecology

## 2023-09-05 NOTE — Progress Notes (Deleted)
 NEW GYNECOLOGY PATIENT Patient name: [name] MRN [id number]  Date of birth: 1985-10-02 Chief Complaint:   No chief complaint on file.     History:  [name] is a 38 y.o. [id number] being seen today for ***.          Gynecologic History No LMP recorded. (Menstrual status: Bilateral Tubal Ligation). Contraception: {method:5051} Last Pap: ***. Result was {norm/abn:16337} with negative HPV Last Mammogram: {NA AND ZHYQMVHQ:46962} Last Colonoscopy: {NA AND XBMWUXLK:44010}  Obstetric History OB History  Gravida Para Term Preterm AB Living  3 2 2  0 1 2  SAB IAB Ectopic Multiple Live Births  0 1 0 0 2    # Outcome Date GA Lbr Len/2nd Weight Sex Type Anes PTL Lv  3 Term 02/13/16 [redacted]w[redacted]d 11:36 / 04:41 8 lb 14.5 oz (4.04 kg) M Vag-Vacuum EPI  LIV  2 IAB           1 Term      Vag-Spont       Past Medical History:  Diagnosis Date   Anemia    Arthritis    Injury of right rotator cuff     Past Surgical History:  Procedure Laterality Date   SHOULDER ARTHROSCOPY WITH ROTATOR CUFF REPAIR Right 07/28/2023   Procedure: Right Shoulder Arthroscopy , Debridement, Sub-Acromial decompression, Distal Clavicle resection, rotator cuff repair;  Surgeon: Francena Hanly, MD;  Location: [facility];  Service: Orthopedics;  Laterality: Right;  120 min   TUBAL LIGATION Bilateral 02/14/2016   Procedure: POST PARTUM TUBAL LIGATION;  Surgeon: Adam Phenix, MD;  Location: [facility];  Service: Gynecology;  Laterality: Bilateral;    Current Outpatient Medications on File Prior to Visit  Medication Sig Dispense Refill   cyclobenzaprine (FLEXERIL) 10 MG tablet Take 1 tablet (10 mg total) by mouth 3 (three) times daily as needed for muscle spasms. 30 tablet 1   ibuprofen (ADVIL) 800 MG tablet Take 1 tablet (800 mg total) by mouth every 6 (six) hours as needed (pain.). 90 tablet 1   ondansetron (ZOFRAN) 4 MG tablet Take 1 tablet (4 mg total) by mouth every 8 (eight) hours as needed for nausea or vomiting.  10 tablet 0   oxyCODONE-acetaminophen (PERCOCET) 5-325 MG tablet Take 1 tablet by mouth every 4 (four) hours as needed (max 6 q). (Patient taking differently: Take 1 tablet by mouth every 4 (four) hours as needed (for pain- max of 6 tablets/24 hours).) 20 tablet 0   No current facility-administered medications on file prior to visit.    Allergies  Allergen Reactions   Latex Other (See Comments)    "Causes dry skin."    Social History:  reports that she has been smoking cigarettes. She has never used smokeless tobacco. She reports current alcohol use. She reports that she does not currently use drugs after having used the following drugs: Marijuana.  Family History  Problem Relation Age of Onset   Cancer Other     The following portions of the patient's history were reviewed and updated as appropriate: allergies, current medications, past family history, past medical history, past social history, past surgical history and problem list.  Review of Systems Pertinent items noted in HPI and remainder of comprehensive ROS otherwise negative.  Physical Exam:  There were no vitals taken for this visit. Physical Exam  Pelvic US 12/25/22 FINDINGS: Uterus   Measurements: 8.8 cm x 4.2 cm x 5.3 cm = volume: 102 mL. No fibroids or other mass visualized.  Endometrium   Thickness: 15 mm.  No focal abnormality visualized.   Right ovary   Measurements: 3.6 cm x 2.2 cm x 3.4 cm = volume: 14 mL. There is a 1.4 cm simple appearing cyst in the right ovary requiring no specific imaging follow-up. The right ovary is otherwise normal in appearance.   Left ovary   Measurements: 3.9 cm x 2.5 cm x 2.8 cm = volume: 14 mL. Normal appearance/no adnexal mass.   Pulsed Doppler evaluation demonstrates normal low-resistance arterial and venous waveforms in both ovaries.   Other: There is no free fluid in the pelvis.   IMPRESSION: 1. No evidence of torsion or other acute finding in the  pelvis. 2. 1.4 cm simple right ovarian cyst requiring no specific imaging follow-up.     Electronically Signed   By: Lesia Hausen M.D.   On: 12/25/2022 18:11   Assessment and Plan:   There are no diagnoses linked to this encounter.   Routine preventative health maintenance measures emphasized. Please refer to After Visit Summary for other counseling recommendations.   Follow-up: No follow-ups on file.      Lorriane Shire, MD Obstetrician & Gynecologist, Faculty [facility] [employer], [facility]

## 2024-04-18 ENCOUNTER — Ambulatory Visit: Payer: Self-pay

## 2024-04-18 NOTE — Telephone Encounter (Signed)
 FYI Only or Action Required?: FYI only for provider: appointment scheduled on 04/19/24 acute and TOC on 08/29/23.  Patient was last seen in primary care on 11/06/2021 by Lendia Boby CROME, NP-C.  Called Nurse Triage reporting Abdominal Pain.  Symptoms began several weeks ago.  Interventions attempted: Rest, hydration, or home remedies.  Symptoms are: gradually worsening.  Triage Disposition: See Physician Within 24 Hours  Patient/caregiver understands and will follow disposition?: Yes   Copied from CRM #[id number]. Topic: Clinical - Red Word Triage >> Apr 18, 2024  3:56 PM Suzette B wrote: [facility] that prompted transfer to Nurse Triage: severe ovarian both sides, more on right, extreme lack of energy, irregular menstrual cycle. Patient is also looking to return to [facility] [place] for a pcp she was unaware that her pcp had left the clinic she was attending Reason for Disposition  [1] MODERATE pain (e.g., interferes with normal activities) AND [2] pain comes and goes (cramps) AND [3] present > 24 hours  (Exception: Pain with Vomiting or Diarrhea - see that Guideline.)  Answer Assessment - Initial Assessment Questions Additional info: 1) Patient requesting annual physical and TOC appointment- scheduled March 2026 with preferred provider. Acute scheduled on 04/19/24 for abdominal pain, dysuria.  2) Has new patient appointment with gyn in December.   1. LOCATION: Where does it hurt?      Lower abdomen, ovarian cyst pain right worse than left.  2. RADIATION: Does the pain shoot anywhere else? (e.g., chest, back)     Denies  3. ONSET: When did the pain begin? (e.g., minutes, hours or days ago)      One year 4. SUDDEN: Gradual or sudden onset?     gradual 5. PATTERN Does the pain come and go, or is it constant?     intermittent 6. SEVERITY: How bad is the pain?  (e.g., Scale 1-10; mild, moderate, or severe)     Cramping  7. RECURRENT SYMPTOM: Have you ever had this type of  stomach pain before? If Yes, ask: When was the last time? and What happened that time?      yes 8. CAUSE: What do you think is causing the stomach pain? (e.g., gallstones, recent abdominal surgery)     Ovarian cyst-diagnosed along time ago.  9. RELIEVING/AGGRAVATING FACTORS: What makes it better or worse? (e.g., antacids, bending or twisting motion, bowel movement)     Holding bladder too long 10. OTHER SYMPTOMS: Do you have any other symptoms? (e.g., back pain, diarrhea, fever, urination pain, vomiting)     Fatigue, irregular period, dysuria  Protocols used: Abdominal Pain - [facility]

## 2024-04-19 ENCOUNTER — Encounter: Payer: Self-pay | Admitting: Family Medicine

## 2024-04-19 ENCOUNTER — Ambulatory Visit: Admitting: Family Medicine

## 2024-04-19 ENCOUNTER — Ambulatory Visit: Payer: Self-pay | Admitting: Family Medicine

## 2024-04-19 VITALS — BP 110/78 | HR 70 | Temp 97.6°F | Ht 64.0 in | Wt 140.0 lb

## 2024-04-19 DIAGNOSIS — Z8742 Personal history of other diseases of the female genital tract: Secondary | ICD-10-CM

## 2024-04-19 DIAGNOSIS — R1030 Lower abdominal pain, unspecified: Secondary | ICD-10-CM

## 2024-04-19 DIAGNOSIS — R300 Dysuria: Secondary | ICD-10-CM

## 2024-04-19 DIAGNOSIS — N939 Abnormal uterine and vaginal bleeding, unspecified: Secondary | ICD-10-CM

## 2024-04-19 DIAGNOSIS — R5383 Other fatigue: Secondary | ICD-10-CM | POA: Diagnosis not present

## 2024-04-19 DIAGNOSIS — E559 Vitamin D deficiency, unspecified: Secondary | ICD-10-CM | POA: Diagnosis not present

## 2024-04-19 LAB — COMPREHENSIVE METABOLIC PANEL WITH GFR
ALT: 12 U/L (ref 0–35)
AST: 18 U/L (ref 0–37)
Albumin: 4.7 g/dL (ref 3.5–5.2)
Alkaline Phosphatase: 50 U/L (ref 39–117)
BUN: 11 mg/dL (ref 6–23)
CO2: 27 meq/L (ref 19–32)
Calcium: 9.5 mg/dL (ref 8.4–10.5)
Chloride: 101 meq/L (ref 96–112)
Creatinine, Ser: 0.78 mg/dL (ref 0.40–1.20)
GFR: 95.22 mL/min (ref >60.00)
Glucose, Bld: 74 mg/dL (ref 70–99)
Potassium: 3.7 meq/L (ref 3.5–5.1)
Sodium: 137 meq/L (ref 135–145)
Total Bilirubin: 0.4 mg/dL (ref 0.2–1.2)
Total Protein: 7.9 g/dL (ref 6.0–8.3)

## 2024-04-19 LAB — FERRITIN: Ferritin: 10.1 ng/mL (ref 10.0–291.0)

## 2024-04-19 LAB — CBC WITH DIFFERENTIAL/PLATELET
Basophils Absolute: 0 K/uL (ref 0.0–0.1)
Basophils Relative: 0.8 % (ref 0.0–3.0)
Eosinophils Absolute: 0 K/uL (ref 0.0–0.7)
Eosinophils Relative: 1 % (ref 0.0–5.0)
HCT: 38.4 % (ref 36.0–46.0)
Hemoglobin: 12.8 g/dL (ref 12.0–15.0)
Lymphocytes Relative: 27.2 % (ref 12.0–46.0)
Lymphs Abs: 1.4 K/uL (ref 0.7–4.0)
MCHC: 33.2 g/dL (ref 30.0–36.0)
MCV: 91.6 fl (ref 78.0–100.0)
Monocytes Absolute: 0.3 K/uL (ref 0.1–1.0)
Monocytes Relative: 6.8 % (ref 3.0–12.0)
Neutro Abs: 3.2 K/uL (ref 1.4–7.7)
Neutrophils Relative %: 64.2 % (ref 43.0–77.0)
Platelets: 266 K/uL (ref 150.0–400.0)
RBC: 4.19 Mil/uL (ref 3.87–5.11)
RDW: 14.4 % (ref 11.5–15.5)
WBC: 5 K/uL (ref 4.0–10.5)

## 2024-04-19 LAB — VITAMIN D 25 HYDROXY (VIT D DEFICIENCY, FRACTURES): VITD: 10.64 ng/mL — ABNORMAL LOW (ref 30.00–100.00)

## 2024-04-19 LAB — TSH: TSH: 1.1 u[IU]/mL (ref 0.35–5.50)

## 2024-04-19 LAB — T4, FREE: Free T4: 0.73 ng/dL (ref 0.60–1.60)

## 2024-04-19 LAB — VITAMIN B12: Vitamin B-12: 288 pg/mL (ref 211–911)

## 2024-04-19 MED ORDER — VITAMIN D (ERGOCALCIFEROL) 1.25 MG (50000 UNIT) PO CAPS: 50000.0000 [IU] | ORAL_CAPSULE | ORAL | 1 refills | Status: AC

## 2024-04-19 NOTE — Addendum Note (Signed)
 Addended by: Hanaa Payes L on: 04/19/2024 09:50 PM   Modules accepted: Orders

## 2024-04-19 NOTE — Patient Instructions (Signed)
 Please go downstairs for labs and urine test.  You will receive a call to get a pelvic ultrasound.  Take ibuprofen  and use a heating pad for pain  See your OB/GYN as scheduled.

## 2024-04-19 NOTE — Progress Notes (Addendum)
 Subjective:     Patient ID: [name], female    DOB: Mar 27, 1986, 38 y.o.   MRN: [id number]  Chief Complaint  Patient presents with   Abdominal Pain    Has cysts on her ovaries and pain has gotten worse so she thinks it may have gotten bigger. Period is becoming irregualr. Will get it sometimes in beginning of month, then middle and end, very inconsistent.   Seeing OBGYN in December     Abdominal Pain Associated symptoms include dysuria. Pertinent negatives include no constipation, diarrhea, fever, frequency, headaches, nausea or vomiting.    Discussed the use of AI scribe software for clinical note transcription with the patient, who gave verbal consent to proceed.  History of Present Illness [name] is a 38 year old female who presents with lower abdominal pain and irregular menstrual bleeding.  Lower abdominal pain - Worsening pain localized primarily to the right lower abdomen, with occasional radiation to the left side - Pain severity has intensified over the past couple of months, currently rated 9 out of 10 - Associated with cramp-like sensations and nausea - No fever, chills, vomiting, diarrhea, or constipation - History of a 1.4 cm simple right ovarian cyst identified in July 2024 - Uses ibuprofen  occasionally and a heating pad for symptom relief  Menstrual irregularity - Irregular menstrual cycles, with periods occurring at the beginning and middle or end of the month, but not more than once monthly - Last menstrual period ended three days ago - Anticipates next period around the 16th or 17th of the following month  Fatigue - Persistent fatigue present  Urinary discomfort - Urinary discomfort, particularly following catheterization during last childbirth - Bladder function affected since catheterization  Sleep disturbance - Difficulty sleeping on back or right side due to discomfort from prior rotator cuff surgery (August 2024) and tailbone injury  (2021) - Occasionally sleeps on the couch for comfort  Self-care limitations - Busy schedule with her sons limits time for self-care - No recent blood work performed - Does not take vitamin D supplements despite history of low levels     Health Maintenance Due  Topic Date Due   Hepatitis C Screening  Never done   DTaP/Tdap/Td (1 - Tdap) Never done   Pneumococcal Vaccine (1 of 2 - PCV) Never done   Hepatitis B Vaccines 19-59 Average Risk (1 of 3 - 19+ 3-dose series) Never done   HPV VACCINES (1 - 3-dose SCDM series) Never done   Cervical Cancer Screening (HPV/Pap Cotest)  Never done   Influenza Vaccine  Never done   COVID-19 Vaccine (1 - 2025-26 season) Never done   Mammogram  03/15/2024    Past Medical History:  Diagnosis Date   Anemia    Arthritis    Injury of right rotator cuff     Past Surgical History:  Procedure Laterality Date   SHOULDER ARTHROSCOPY WITH ROTATOR CUFF REPAIR Right 07/28/2023   Procedure: Right Shoulder Arthroscopy , Debridement, Sub-Acromial decompression, Distal Clavicle resection, rotator cuff repair;  Surgeon: Melita Drivers, MD;  Location: [facility];  Service: Orthopedics;  Laterality: Right;  120 min   TUBAL LIGATION Bilateral 02/14/2016   Procedure: POST PARTUM TUBAL LIGATION;  Surgeon: Lynwood KANDICE Solomons, MD;  Location: [facility];  Service: Gynecology;  Laterality: Bilateral;    Family History  Problem Relation Age of Onset   Cancer Other     Social History   Socioeconomic History   Marital status: Single  Spouse name: Not on file   Number of children: Not on file   Years of education: Not on file   Highest education level: GED or equivalent  Occupational History   Not on file  Tobacco Use   Smoking status: Every Day    Current packs/day: 0.00    Types: Cigarettes    Last attempt to quit: 05/06/2015    Years since quitting: 8.9   Smokeless tobacco: Never  Vaping Use   Vaping status: Never Used  Substance and Sexual Activity   Alcohol  use: Yes    Comment: social   Drug use: Not Currently    Types: Marijuana   Sexual activity: Never    Birth control/protection: Surgical    Comment: pt plans to get tubes tied  Other Topics Concern   Not on file  Social History Narrative   Not on file   Social Drivers of Health   Financial Resource Strain: Low Risk  (04/18/2024)   Overall Financial Resource Strain (CARDIA)    Difficulty of Paying Living Expenses: Not hard at all  Food Insecurity: No Food Insecurity (04/18/2024)   Hunger Vital Sign    Worried About Running Out of Food in the Last Year: Never true    Ran Out of Food in the Last Year: Never true  Transportation Needs: No Transportation Needs (04/18/2024)   PRAPARE - Administrator, Civil Service (Medical): No    Lack of Transportation (Non-Medical): No  Physical Activity: Inactive (04/18/2024)   Exercise Vital Sign    Days of Exercise per Week: 0 days    Minutes of Exercise per Session: Not on file  Stress: Stress Concern Present (04/18/2024)   [employer] of Occupational Health - Occupational Stress Questionnaire    Feeling of Stress: To some extent  Social Connections: Socially Isolated (04/18/2024)   Social Connection and Isolation Panel    Frequency of Communication with Friends and Family: Twice a week    Frequency of Social Gatherings with Friends and Family: Twice a week    Attends Religious Services: Never    Database Administrator or Organizations: No    Attends Engineer, Structural: Not on file    Marital Status: Never married  Intimate Partner Violence: Unknown (09/24/2021)   Received from [facility]   HITS    Physically Hurt: Not on file    Insult or Talk Down To: Not on file    Threaten Physical Harm: Not on file    Scream or Curse: Not on file    Outpatient Medications Prior to Visit  Medication Sig Dispense Refill   cyclobenzaprine  (FLEXERIL ) 10 MG tablet Take 1 tablet (10 mg total) by mouth 3 (three) times  daily as needed for muscle spasms. 30 tablet 1   ibuprofen  (ADVIL ) 800 MG tablet Take 1 tablet (800 mg total) by mouth every 6 (six) hours as needed (pain.). 90 tablet 1   ondansetron  (ZOFRAN ) 4 MG tablet Take 1 tablet (4 mg total) by mouth every 8 (eight) hours as needed for nausea or vomiting. 10 tablet 0   oxyCODONE -acetaminophen  (PERCOCET) 5-325 MG tablet Take 1 tablet by mouth every 4 (four) hours as needed (max 6 q). (Patient taking differently: Take 1 tablet by mouth every 4 (four) hours as needed (for pain- max of 6 tablets/24 hours).) 20 tablet 0   No facility-administered medications prior to visit.    Allergies  Allergen Reactions   Latex Other (See Comments)  Causes dry skin.    Review of Systems  Constitutional:  Positive for malaise/fatigue. Negative for chills and fever.  Eyes:  Negative for blurred vision, double vision and photophobia.  Respiratory:  Negative for shortness of breath.   Cardiovascular:  Negative for chest pain, palpitations and leg swelling.  Gastrointestinal:  Positive for abdominal pain. Negative for blood in stool, constipation, diarrhea, nausea and vomiting.  Genitourinary:  Positive for dysuria. Negative for frequency and urgency.  Musculoskeletal:  Positive for back pain.  Neurological:  Negative for dizziness, focal weakness and headaches.       Objective:    Physical Exam Constitutional:      General: She is not in acute distress.    Appearance: She is not ill-appearing.  HENT:     Mouth/Throat:     Mouth: Mucous membranes are moist.  Eyes:     Extraocular Movements: Extraocular movements intact.     Conjunctiva/sclera: Conjunctivae normal.  Cardiovascular:     Rate and Rhythm: Normal rate and regular rhythm.     Heart sounds: Normal heart sounds.  Pulmonary:     Effort: Pulmonary effort is normal.     Breath sounds: Normal breath sounds.  Abdominal:     General: Abdomen is flat. Bowel sounds are normal.     Palpations:  Abdomen is soft.     Tenderness: There is abdominal tenderness in the right lower quadrant, suprapubic area and left lower quadrant. There is no right CVA tenderness, left CVA tenderness, guarding or rebound. Negative signs include Murphy's sign, Rovsing's sign and McBurney's sign.  Musculoskeletal:     Cervical back: Normal range of motion and neck supple.  Skin:    General: Skin is warm and dry.  Neurological:     General: No focal deficit present.     Mental Status: She is alert and oriented to person, place, and time.  Psychiatric:        Mood and Affect: Mood normal.        Behavior: Behavior normal.        Thought Content: Thought content normal.      BP 110/78   Pulse 70   Temp 97.6 F (36.4 C) (Temporal)   Ht 5' 4 (1.626 m)   Wt 140 lb (63.5 kg)   LMP 04/15/2024   SpO2 100%   BMI 24.03 kg/m  Wt Readings from Last 3 Encounters:  04/19/24 140 lb (63.5 kg)  07/31/23 146 lb (66.2 kg)  07/28/23 138 lb (62.6 kg)       Assessment & Plan:   Problem List Items Addressed This Visit     Abnormal uterine bleeding (AUB) - Primary   Relevant Orders   CBC with Differential/Platelet (Completed)   Comprehensive metabolic panel with GFR (Completed)   TSH (Completed)   T4, free (Completed)   hCG, serum, qualitative   US  PELVIC COMPLETE WITH TRANSVAGINAL   Fatigue   Relevant Orders   CBC with Differential/Platelet (Completed)   Comprehensive metabolic panel with GFR (Completed)   Ferritin (Completed)   TSH (Completed)   Vitamin B12 (Completed)   T4, free (Completed)   History of abnormal cervical Pap smear   Lower abdominal pain   Relevant Orders   US  PELVIC COMPLETE WITH TRANSVAGINAL   Urinalysis with Culture Reflex   Vitamin D deficiency   Relevant Medications   Vitamin D, Ergocalciferol, (DRISDOL) 1.25 MG (50000 UNIT) CAPS capsule   Other Relevant Orders   VITAMIN D 25 Hydroxy (Vit-D Deficiency,  Fractures) (Completed)   Other Visit Diagnoses       History of  ovarian cyst       Relevant Orders   US  PELVIC COMPLETE WITH TRANSVAGINAL     Dysuria       Relevant Orders   Urinalysis with Culture Reflex       Assessment and Plan Assessment & Plan Lower abdominal pain and chronic pelvic pain Chronic lower abdominal pain, primarily on the right side, with a severity of 9 out of 10. Pain is associated with nausea but no fever, chills, vomiting, diarrhea, or constipation. Pain may be related to the right ovarian cyst or uterine fibroids. - Order pelvic ultrasound to evaluate the cause of pain - Advise taking ibuprofen  to manage pain and inflammation  Irregular menstrual bleeding Irregular menstrual cycles with bleeding occurring at inconsistent intervals. No more than one period per month, but timing is unpredictable. No chance of pregnancy due to prior tubal ligation. - Order blood work to check iron levels and rule out anemia - Advise taking ibuprofen  to manage menstrual symptoms and reduce bleeding - Appointment with OB/GYN in December   Right ovarian cyst and uterine fibroids 1.4 cm simple right ovarian cyst noted in July 2024, which did not require follow-up at that time. Current symptoms suggest possible enlargement or persistence of the cyst or fibroids. - Order pelvic ultrasound to assess the current status of the ovarian cyst and fibroids - follow up with OB/GYN as scheduled.   Fatigue Persistent fatigue reported. Possible contributing factors include irregular menstrual bleeding, potential anemia, and vitamin D deficiency. - Order blood work to check thyroid function and vitamin D levels - Order blood work to check iron levels and rule out anemia  Urinary retention symptoms Discomfort and difficulty with urination, possibly related to a past catheterization. No burning, urgency, or frequency reported. - Advise discussing urinary symptoms with OBGYN during pelvic exam  - Urinalysis with reflex to urine culture ordered   General Health  Maintenance Due for first mammogram as she is now 38 years old. - Advise scheduling first mammogram. She will discuss with OB/GYN  Vitamin D deficiency - vitamin D level 10.64 - high dose once weekly vitamin D sent to pharmacy      I have discontinued Donald L. Surette's ibuprofen , oxyCODONE -acetaminophen , cyclobenzaprine , and ondansetron . I am also having her start on Vitamin D (Ergocalciferol).  Meds ordered this encounter  Medications   Vitamin D, Ergocalciferol, (DRISDOL) 1.25 MG (50000 UNIT) CAPS capsule    Sig: Take 1 capsule (50,000 Units total) by mouth every 7 (seven) days.    Dispense:  6 capsule    Refill:  1    Supervising Provider:   ROLLENE NORRIS A [[id number]]

## 2024-04-20 LAB — HCG, SERUM, QUALITATIVE: Preg, Serum: NEGATIVE

## 2024-04-23 LAB — URINALYSIS W MICROSCOPIC + REFLEX CULTURE

## 2024-05-11 ENCOUNTER — Ambulatory Visit (HOSPITAL_BASED_OUTPATIENT_CLINIC_OR_DEPARTMENT_OTHER)
Admission: RE | Admit: 2024-05-11 | Discharge: 2024-05-11 | Disposition: A | Discharge status: Home / Self Care | Source: Ambulatory Visit | Attending: Family Medicine | Admitting: Family Medicine

## 2024-05-11 DIAGNOSIS — R1030 Lower abdominal pain, unspecified: Secondary | ICD-10-CM | POA: Diagnosis present

## 2024-05-11 DIAGNOSIS — N939 Abnormal uterine and vaginal bleeding, unspecified: Secondary | ICD-10-CM | POA: Diagnosis present

## 2024-05-11 DIAGNOSIS — Z8742 Personal history of other diseases of the female genital tract: Secondary | ICD-10-CM | POA: Insufficient documentation

## 2024-05-11 NOTE — Progress Notes (Signed)
 Please let her know that her ultrasound showed a fibroid and possible endometriosis.  She has an appointment with her OB/GYN in December to follow-up on these findings and for further evaluation.

## 2024-06-08 ENCOUNTER — Encounter: Admitting: Obstetrics & Gynecology

## 2024-06-28 ENCOUNTER — Encounter: Admitting: Obstetrics & Gynecology

## 2024-07-30 ENCOUNTER — Encounter: Admitting: Family Medicine

## 2024-08-28 ENCOUNTER — Encounter: Admitting: Family Medicine
